# Patient Record
Sex: Female | Born: 1955 | Race: White | Hispanic: No | Smoking: Never smoker
Health system: Southern US, Community
[De-identification: ages and names within clinical notes are randomized; demographics above are authoritative.]

## PROBLEM LIST (undated history)

## (undated) HISTORY — PX: CHOLECYSTECTOMY: SHX55

---

## 2005-10-13 ENCOUNTER — Ambulatory Visit (HOSPITAL_COMMUNITY): Admission: RE | Admit: 2005-10-13 | Discharge: 2005-10-13 | Payer: Self-pay | Admitting: Surgery

## 2005-10-13 ENCOUNTER — Encounter (INDEPENDENT_AMBULATORY_CARE_PROVIDER_SITE_OTHER): Payer: Self-pay | Admitting: *Deleted

## 2006-08-08 LAB — CONVERTED CEMR LAB: Pap Smear: NORMAL

## 2006-12-22 IMAGING — RF DG CHOLANGIOGRAM OPERATIVE
1 series · 4 of 4 positions shown · non-contrast
Comparison: none

CLINICAL DATA: Gallstones.  
OPERATIVE CHOLANGIOGRAM ? 61 C-ARM GUIDED FLUOROSCOPIC IMAGES:

[Series 1: run · 4 of 61 frames shown]
[frame 10/61]
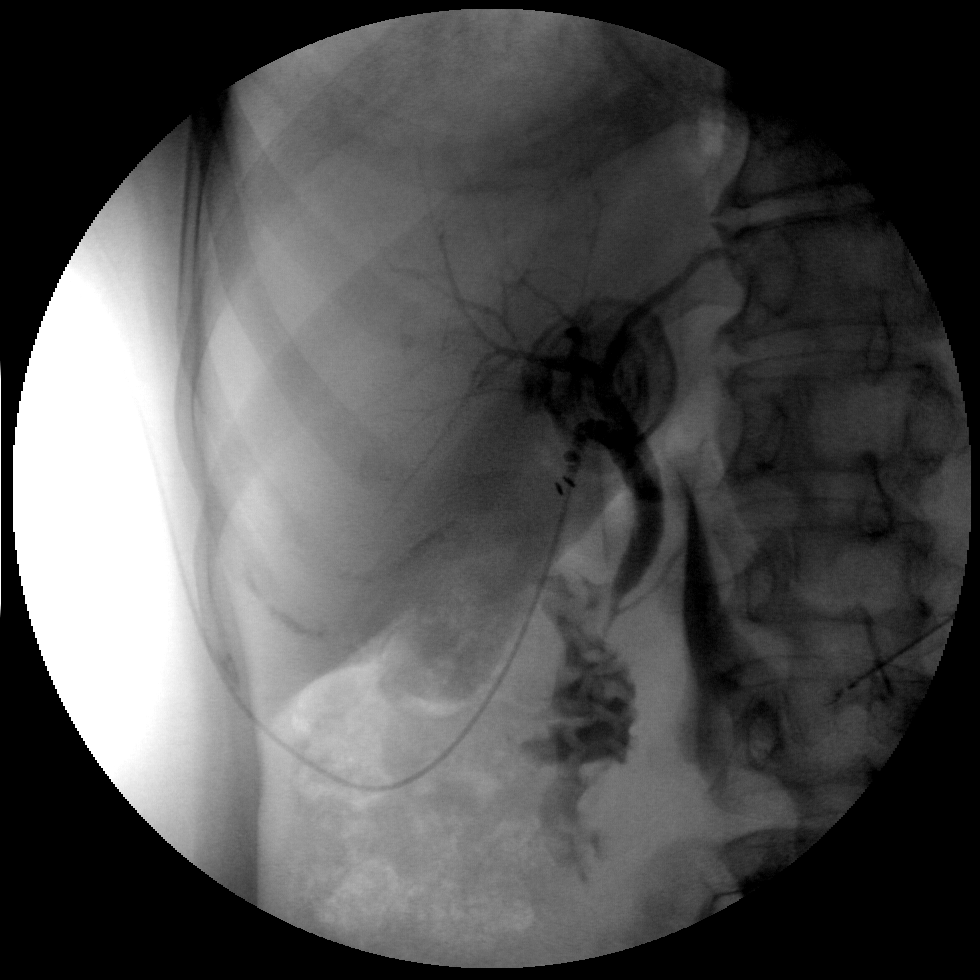
[frame 31/61]
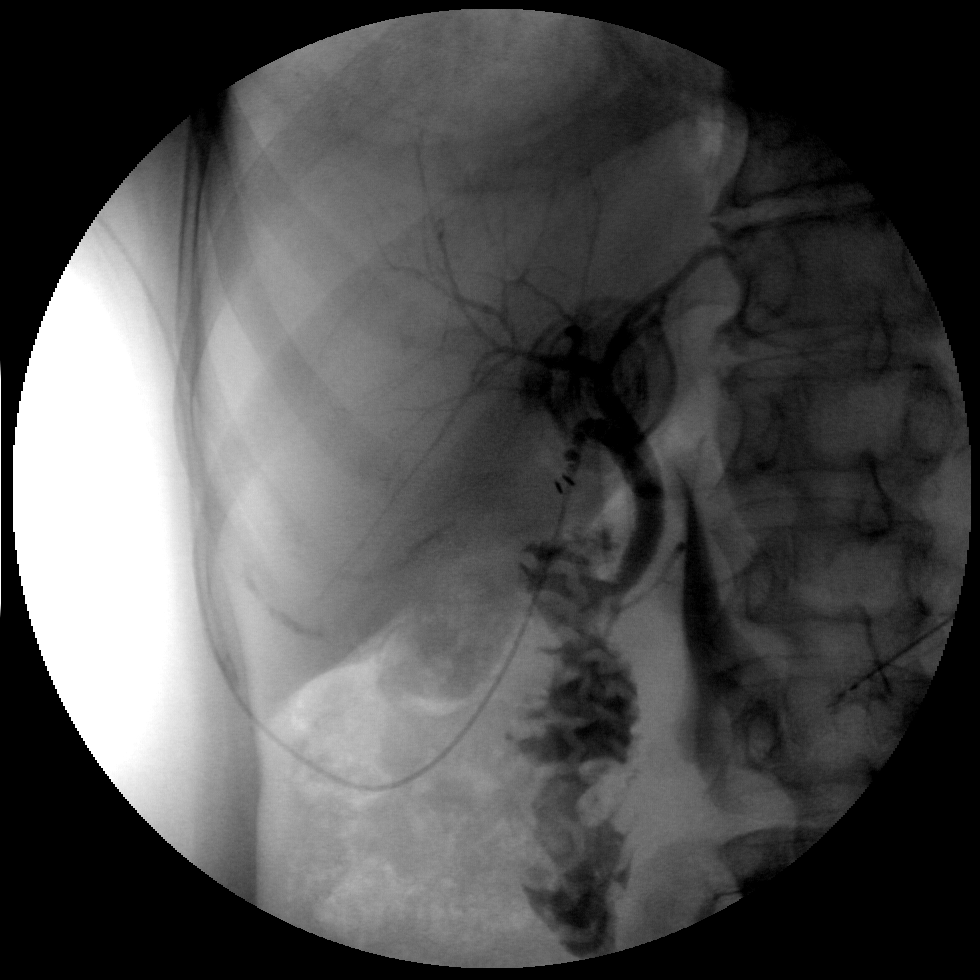
[frame 41/61]
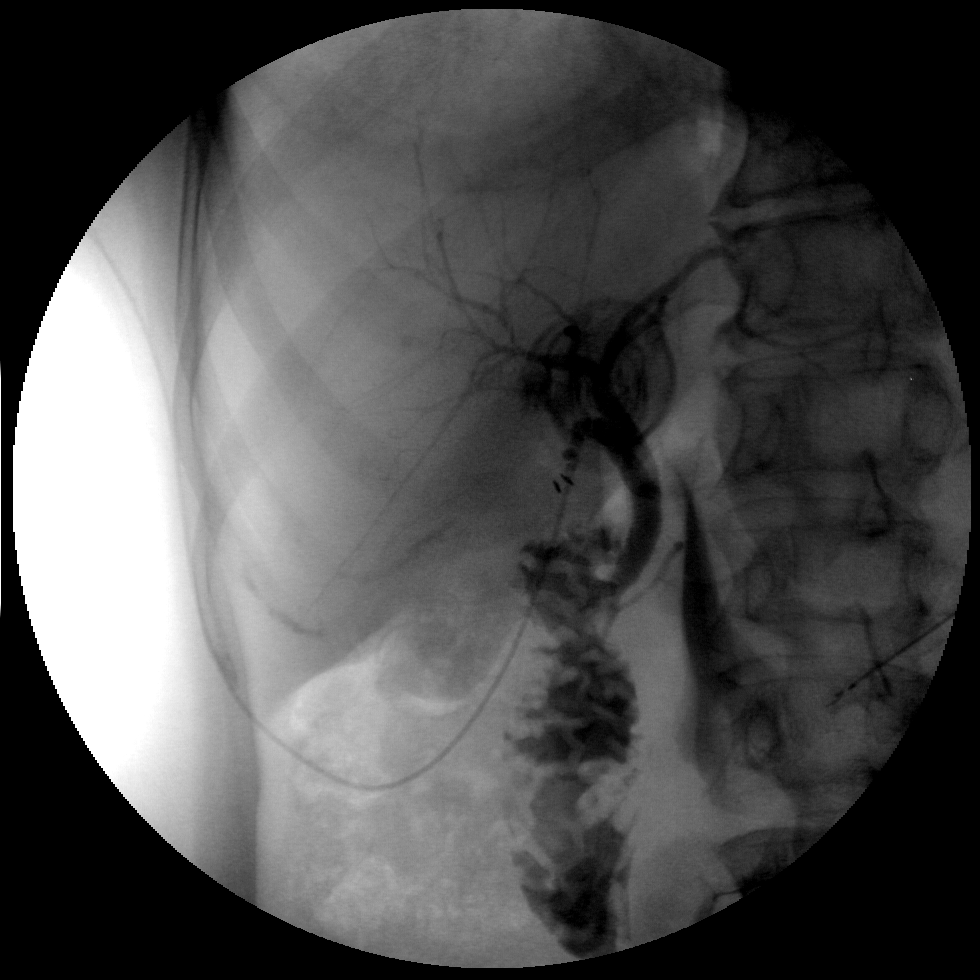
[frame 52/61]
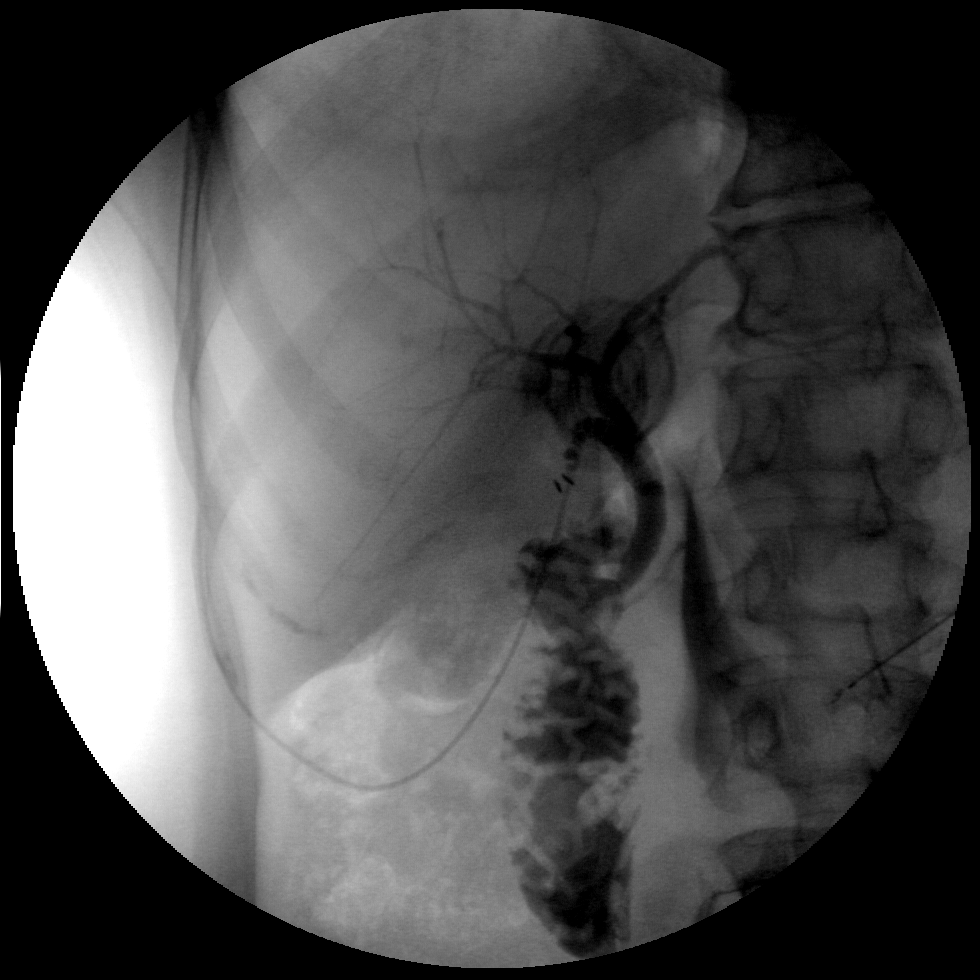

[4 of 4 positions shown; findings below may reference images not displayed]

FINDINGS: Excellent contrast opacification of the intra and extrahepatic bile ducts.  Reflux of contrast into the pancreatic duct.  No ductal stricture, dilatation, or filling defects.  Large apparently calcified laminated oval structure projecting over the biliary tree may represent a large gallstone.
IMPRESSION: Negative operative cholangiogram.

## 2008-07-21 ENCOUNTER — Encounter: Payer: Self-pay | Admitting: Family Medicine

## 2008-08-08 HISTORY — PX: COLONOSCOPY: SHX174

## 2009-03-20 ENCOUNTER — Ambulatory Visit: Payer: Self-pay | Admitting: Family Medicine

## 2009-07-14 ENCOUNTER — Ambulatory Visit: Payer: Self-pay | Admitting: Family Medicine

## 2009-07-14 DIAGNOSIS — H6500 Acute serous otitis media, unspecified ear: Secondary | ICD-10-CM | POA: Insufficient documentation

## 2010-12-24 NOTE — Op Note (Signed)
NAME:  Jaime Briggs, Jaime Briggs               ACCOUNT NO.:  [id number]   MEDICAL RECORD NO.:  [id number]          PATIENT TYPE:  AMB   LOCATION:  DAY                          FACILITY:  [facility]   PHYSICIAN:  Thomas A. Cornett, M.D.DATE OF BIRTH:  02/17/1956   DATE OF PROCEDURE:  10/13/2005  DATE OF DISCHARGE:                                 OPERATIVE REPORT   PREOP DIAGNOSIS:  Symptomatic cholelithiasis.   POSTOP DIAGNOSIS:  Symptomatic cholelithiasis.   PROCEDURE:  Laparoscopic cholecystectomy with intraoperative cholangiogram.   SURGEON:  Thomas A. Cornett, M.D.   ASSISTANT:  Sheppard Plumber. Earlene Plater, M.D.   ANESTHESIA:  General endotracheal anesthesia.   ESTIMATED BLOOD LOSS:  20 mL.   DRAINS:  None.   SPECIMEN:  Gallbladder to pathology for evaluation.   INDICATIONS FOR PROCEDURE:  The patient is a 55 year old female whose had  symptomatic cholelithiasis. She was symptomatic for abdominal pain and  gallstones; and I recommended a laparoscopic cholecystectomy for definitive  treatment of this. Risks of the procedure were discussed as well as  potential long-term outcomes and expectations. She understood and agreed to  proceed after receiving informed consent.   DESCRIPTION OF PROCEDURE:  The patient was brought to the operating room and  placed supine. After induction of general endotracheal anesthesia the  abdomen was prepped and draped in a sterile fashion. A 1-cm, supraumbilical  incision was made. Dissection was carried down to her fascia. Her fascia was  grasped with a Kocher and a small incision was made in the fascia at the  umbilicus. A second Kocher clamp was used to grab the other edge; and this  was opened with the scalpel blade more.   I placed a hemostat to open the peritoneal lining and then placed my finger  in the preperitoneal space and pushed on through. I swept around and felt no  adhesions. Pursestring suture of #0 Vicryl was placed; and a 12-mm Hassan  cannula was  placed under direct vision. Pneumoperitoneum was created to 15  mmHg with CO2 and a laparoscope was placed. The patient was placed in  reverse Trendelenburg and rolled toward her left. Laparoscopy was performed.  No evidence of solid organ or hollow organ injury with insertion of Hasson.   Next a 5-mm port was placed in the subxiphoid position. Two other 5-mm ports  were placed both in the right mid abdomen under direct vision with local  anesthesia. The gallbladder is identified and grasped by its dome. The dome  was retracted toward the patient's right shoulder. A second grasper was used  to grab the infundibulum. I used the dissector to pull away some filmy  adhesions to the transverse colon. Once the infundibulum was grasped, I was  able to cut the peritoneum at the junction of the cystic duct and  gallbladder infundibulum. I then was able to push away the loose areolar  tissue and dissect circumferentially around the cystic duct. The common duct  was well-visualized and out of the operative field. Once I was able to  dissect out the cystic duct circumferentially, I placed a clip on the  gallbladder side of this. Small incision was made in the cystic duct for  cholangiogram. Through a separate stab incision a Cook cholangiogram  catheter was introduced under direct vision. This was placed in the cystic  duct controlled with a clip.   Intraoperative cholangiogram using 1/2 strength Hypaque dye and fluoroscopy  was then performed which showed filling of the cystic duct into the common  duct with free flow of bile down the common duct into the duodenum. There  was also a free flow of contrast up the common hepatic duct to the  bifurcation of right and left hepatic ducts. No evidence of stones,  stricture, or injury were identified at this point. The catheter was then  removed and the cystic duct stump was triple clipped and divided.   I then dissected out the cystic artery which had a  branch that went up under  the body of the gallbladder and a second branch that went along the lateral  aspect of the gallbladder. I controlled each branch with clips and divided  that. Cautery was then used to dissect the gallbladder from the gallbladder  fossa. Using a 5-mm scope, I extracted the gallbladder from the abdominal  cavity using an EndoCatch bag; and this was passed off the field. I then  reinserted a 10-mm scope, inspected the gallbladder bed, and found it to be  hemostatic. The clips were around the cystic artery and branches as well as  the cystic duct. There was no obvious injury to the duodenum colon or  undersurface of the liver with examination as well. Irrigation was used and  was suctioned out until clear. Hemostasis was excellent.   At this point I saw no evidence of solid or hollow organ injury following  inspection of the abdominal cavity. I then withdrew the ports under direct  vision, withdrew the laparoscope and released the CO2. Umbilical port fascia  was closed with #0 Vicryl, 4-0 Monocryl was used to close skin incisions.  Steri-Strips and dry dressings were applied. All final counts of sponge,  needle, and instruments were found be correct at this portion of the case.  The patient was awoke, taken to recovery in satisfactory condition.      Thomas A. Cornett, M.D.  Electronically Signed     TAC/MEDQ  D:  10/13/2005  T:  10/14/2005  Job:  [id number]

## 2011-06-01 ENCOUNTER — Ambulatory Visit (INDEPENDENT_AMBULATORY_CARE_PROVIDER_SITE_OTHER): Payer: BC Managed Care – PPO

## 2011-06-01 DIAGNOSIS — Z23 Encounter for immunization: Secondary | ICD-10-CM

## 2012-01-29 ENCOUNTER — Encounter (HOSPITAL_COMMUNITY): Payer: Self-pay

## 2012-01-29 ENCOUNTER — Emergency Department (HOSPITAL_COMMUNITY)
Admission: EM | Admit: 2012-01-29 | Discharge: 2012-01-29 | Disposition: A | Discharge status: Home / Self Care | Payer: BC Managed Care – PPO | Attending: Emergency Medicine | Admitting: Emergency Medicine

## 2012-01-29 DIAGNOSIS — L509 Urticaria, unspecified: Secondary | ICD-10-CM | POA: Insufficient documentation

## 2012-01-29 DIAGNOSIS — IMO0001 Reserved for inherently not codable concepts without codable children: Secondary | ICD-10-CM | POA: Insufficient documentation

## 2012-01-29 DIAGNOSIS — W57XXXA Bitten or stung by nonvenomous insect and other nonvenomous arthropods, initial encounter: Secondary | ICD-10-CM

## 2012-01-29 MED ORDER — CEPHALEXIN 250 MG PO CAPS: 250.0000 mg | ORAL_CAPSULE | Freq: Four times a day (QID) | ORAL | Status: AC

## 2012-01-29 MED ORDER — PREDNISONE 10 MG PO TABS: 40.0000 mg | ORAL_TABLET | Freq: Every day | ORAL | Status: DC

## 2012-01-29 NOTE — ED Notes (Signed)
Discharge instructions reviewed with patient no questions at this time  

## 2012-01-29 NOTE — Discharge Instructions (Signed)
Insect Bite Mosquitoes, flies, fleas, bedbugs, and many other insects can bite. Insect bites are different from insect stings. A sting is when venom is injected into the skin. Some insect bites can transmit infectious diseases. SYMPTOMS  Insect bites usually turn red, swell, and itch for 2 to 4 days. They often go away on their own. TREATMENT  Your caregiver may prescribe antibiotic medicines if a bacterial infection develops in the bite. HOME CARE INSTRUCTIONS  Do not scratch the bite area.   Keep the bite area clean and dry. Wash the bite area thoroughly with soap and water.   Put ice or cool compresses on the bite area.   Put ice in a plastic bag.   Place a towel between your skin and the bag.   Leave the ice on for 20 minutes, 4 times a day for the first 2 to 3 days, or as directed.   You may apply a baking soda paste, cortisone cream, or calamine lotion to the bite area as directed by your caregiver. This can help reduce itching and swelling.   Only take over-the-counter or prescription medicines as directed by your caregiver.   If you are given antibiotics, take them as directed. Finish them even if you start to feel better.  You may need a tetanus shot if:  You cannot remember when you had your last tetanus shot.   You have never had a tetanus shot.   The injury broke your skin.  If you get a tetanus shot, your arm may swell, get red, and feel warm to the touch. This is common and not a problem. If you need a tetanus shot and you choose not to have one, there is a rare chance of getting tetanus. Sickness from tetanus can be serious. SEEK IMMEDIATE MEDICAL CARE IF:   You have increased pain, redness, or swelling in the bite area.   You see a red line on the skin coming from the bite.   You have a fever.   You have joint pain.   You have a headache or neck pain.   You have unusual weakness.   You have a rash.   You have chest pain or shortness of breath.   You  have abdominal pain, nausea, or vomiting.   You feel unusually tired or sleepy.  MAKE SURE YOU:   Understand these instructions.   Will watch your condition.   Will get help right away if you are not doing well or get worse.  Document Released: 09/01/2004 Document Revised: 07/14/2011 Document Reviewed: 02/23/2011 [employer] Patient Information 2012 [employer]. 

## 2012-01-29 NOTE — ED Provider Notes (Signed)
Medical screening examination/treatment/procedure(s) were performed by non-physician practitioner and as supervising physician I was immediately available for consultation/collaboration.  Lily Kernen R. Nahia Nissan, MD 01/29/12 1414 

## 2012-01-29 NOTE — ED Notes (Signed)
Pt c/o bug bite on left forearm, and itchy rash on inner thighs both onset Friday.

## 2012-01-29 NOTE — ED Provider Notes (Signed)
History     CSN: [id number]  Arrival date & time 01/29/12  5621   First MD Initiated Contact with Patient 01/29/12 [phone]      Chief Complaint  Patient presents with  . Rash    (Consider location/radiation/quality/duration/timing/severity/associated sxs/prior treatment) HPI  Patient to the ED with complaints of rash that itches on inner thighs and bug bite to left forearm. She says the symptoms have been their since Friday and she does not believe to be related. The patient did not see an insect bite her and the rash to the forearm does not itch or hurt. It does not feel warm. She denies knowing what she may have come into contact with. She denies N/V/D/F/ chills or generalized weakness and myalgias. VSS and pt in NAD  No past medical history on file.  Past Surgical History  Procedure Date  . Cholecystectomy     No family history on file.  History  Substance Use Topics  . Smoking status: Never Smoker   . Smokeless tobacco: Not on file  . Alcohol Use: Yes     occasionally    OB History    Grav Para Term Preterm Abortions TAB SAB Ect Mult Living                  Review of Systems   HEENT: denies blurry vision or change in hearing PULMONARY: Denies difficulty breathing and SOB CARDIAC: denies chest pain or heart palpitations MUSCULOSKELETAL:  denies being unable to ambulate ABDOMEN AL: denies abdominal pain GU: denies loss of bowel or urinary control NEURO: denies numbness and tingling in extremities SKIN: pt has two new rashes PSYCH: patient behavior is normal NECK: Not complaining of neck pain     Allergies  Review of patient's allergies indicates no known allergies.  Home Medications   Current Outpatient Rx  Name Route Sig Dispense Refill  . CALCIUM + D PO Oral Take 2 tablets by mouth daily.    . OMEGA-3 FATTY ACIDS 1000 MG PO CAPS Oral Take 1 g by mouth daily.    . ADULT MULTIVITAMIN W/MINERALS CH Oral Take 1 tablet by mouth daily.      BP 122/92   Pulse 66  Temp 98 F (36.7 C) (Oral)  Resp 18  SpO2 100%  Physical Exam  Nursing note and vitals reviewed. Constitutional: She appears well-developed and well-nourished. No distress.  HENT:  Head: Normocephalic and atraumatic.  Eyes: Pupils are equal, round, and reactive to light.  Neck: Normal range of motion. Neck supple.  Cardiovascular: Normal rate and regular rhythm.   Pulmonary/Chest: Effort normal.  Abdominal: Soft.  Neurological: She is alert.  Skin: Skin is warm and dry.       ED Course  Procedures (including critical care time)  Labs Reviewed - No data to display No results found.   1. Insect bite       MDM  I have prescribed Keflex and prednisone to patient. As well as discussed the importance of scheduling an appointment with the Dermatologist for follow-up. I see no red flag signs at this time but have discussed symptoms that warrant her prompt return to the ED.  Pt has been advised of the symptoms that warrant their return to the ED. Patient has voiced understanding and has agreed to follow-up with the PCP or specialist.         Dorthula Matas, PA 01/29/12 [phone]

## 2012-02-01 ENCOUNTER — Ambulatory Visit (INDEPENDENT_AMBULATORY_CARE_PROVIDER_SITE_OTHER): Payer: BC Managed Care – PPO | Admitting: Family Medicine

## 2012-02-01 VITALS — BP 140/80 | Temp 98.7°F | Wt 119.0 lb

## 2012-02-01 DIAGNOSIS — R21 Rash and other nonspecific skin eruption: Secondary | ICD-10-CM

## 2012-02-01 MED ORDER — METHYLPREDNISOLONE ACETATE 80 MG/ML IJ SUSP: 80.0000 mg | Freq: Once | INTRAMUSCULAR | Status: DC

## 2012-02-01 NOTE — Progress Notes (Signed)
  Subjective:    Patient ID: Jaime Briggs, female    DOB: 07/29/1956, 56 y.o.   MRN: [id number]  HPI  Acute visit. Pruritic erythematous macular rash noted less than one week ago.  Rash is nonscaly and nonpustular. Initially noted involvement right thigh and subsequently left inner thigh. Also involvement posterior neck and forehead region.  Patient went to the emergency department and was prescribed oral prednisone 40 mg daily for 4 days. Thus far has not seen much improvement. Possibly some mild relief of itching. No fever. No tenderness. No history of eczema. She's also tried topical cortisone cream without much improvement. No exacerbating factors.   Review of Systems  Constitutional: Negative for fever and chills.  Hematological: Negative for adenopathy.       Objective:   Physical Exam  Constitutional: She appears well-developed and well-nourished. No distress.  Cardiovascular: Normal rate and regular rhythm.   Skin:       Patient has macular erythematous rash with scattered patches for head, posterior neck, right and left inner thighs. No pustules. Nonscaly. Nontender. Not warm to touch. Blanches with pressure. Minimally raised. No vesicles.          Assessment & Plan:  Skin rash widely distributed as above. Appears more compatible with contact dermatitis. No evidence for eczema. Depo-Medrol 80 mg IM given. Touch base by next week if not seeing improvements.

## 2012-02-01 NOTE — Patient Instructions (Addendum)
Touch base by early next week if rash not resolving.

## 2012-02-03 ENCOUNTER — Emergency Department (HOSPITAL_COMMUNITY)
Admission: EM | Admit: 2012-02-03 | Discharge: 2012-02-03 | Disposition: A | Discharge status: Home / Self Care | Payer: BC Managed Care – PPO | Attending: Emergency Medicine | Admitting: Emergency Medicine

## 2012-02-03 ENCOUNTER — Encounter (HOSPITAL_COMMUNITY): Payer: Self-pay | Admitting: *Deleted

## 2012-02-03 DIAGNOSIS — T7840XA Allergy, unspecified, initial encounter: Secondary | ICD-10-CM | POA: Insufficient documentation

## 2012-02-03 MED ORDER — FAMOTIDINE 20 MG PO TABS: 20.0000 mg | ORAL_TABLET | Freq: Two times a day (BID) | ORAL | Status: DC

## 2012-02-03 MED ORDER — FAMOTIDINE 20 MG PO TABS
20.0000 mg | ORAL_TABLET | Freq: Once | ORAL | Status: AC
  Administered 2012-02-03: 20 mg via ORAL
  Filled 2012-02-03: qty 1

## 2012-02-03 MED ORDER — PREDNISONE 20 MG PO TABS: 60.0000 mg | ORAL_TABLET | Freq: Every day | ORAL | Status: AC

## 2012-02-03 MED ORDER — PREDNISONE 20 MG PO TABS
60.0000 mg | ORAL_TABLET | Freq: Once | ORAL | Status: AC
  Administered 2012-02-03: 60 mg via ORAL
  Filled 2012-02-03: qty 3

## 2012-02-03 MED ORDER — DIPHENHYDRAMINE HCL 50 MG/ML IJ SOLN
25.0000 mg | Freq: Once | INTRAMUSCULAR | Status: AC
  Administered 2012-02-03: 25 mg via INTRAMUSCULAR
  Filled 2012-02-03: qty 1

## 2012-02-03 NOTE — ED Notes (Signed)
Pt verbalizes understanding of d/c instructions. Alert & oriented x 4. Pt aware of md f/u.  Out of dept with spouse. dph

## 2012-02-03 NOTE — Discharge Instructions (Signed)
Read instructions below to learn more about your diagnosis and for reasons to return to the ED. Followup with your doctor if symptoms are not improving after 3-4 days.   You may return to the emergency department if symptoms worsen, become progressive, or become more concerning.  Take Benadryl and Pepcid daily.  Take Prednisone as prescribed.    Allergic Reaction  There are many allergens around [place]. It may be difficult to know what caused your reaction. You may follow up with an allergy specialist for further testing to learn more about your specific allergies.   TREATMENT AND HOME CARE INSTRUCTIONS  If hives or rash are present:  Use an over-the-counter antihistamine (Benadryl or Zyrtec) for hives and itching as needed. Do not drive or drink alcohol until medications used to treat the reaction have worn off. Antihistamines tend to make people sleepy.  Apply cold cloths (compresses) to the skin or take baths in cool water. This will help itching. Avoid hot baths or showers. Heat will make a rash and itching worse.  See Your Primary Care Doctor if:  Your allergies are becoming progressively more troublesome.  You suspect a food allergy. Symptoms generally happen within 30 minutes of eating a food.  Your symptoms have not gone away within 2 days.  SEEK IMMEDIATE MEDICAL CARE IF:  You develop difficulty breathing or wheezing, or have a tight feeling in your chest or throat (feeling like your throat is closing) You develop swollen lips or tongue You develop hives on your chest, neck or face. You are unable to swallow fluids or salvia secretions.   A severe reaction with any of the above problems should be considered life-threatening. If you suddenly develop difficulty breathing call for local emergency medical help. THIS IS AN EMERGENCY.

## 2012-02-03 NOTE — ED Notes (Signed)
Pt states she noticed a "bug bite" to left forearm on Friday and developed a rash scattered on her body. Pt seen in this ED on Sunday; pt states rash has now worsened and is burning at times. Pt with no noted respiratory distress. Husband at bedside.

## 2012-02-03 NOTE — ED Provider Notes (Signed)
History     CSN: [id number]  Arrival date & time 02/03/12  2130   First MD Initiated Contact with Patient 02/03/12 [phone]      Chief Complaint  Patient presents with  . Allergic Reaction    insect bite    (Consider location/radiation/quality/duration/timing/severity/associated sxs/prior treatment) HPI Comments: Patient reports that she has had an erythematous pruitic rash on her forehead, posterior neck, left arm, and upper thighs bilaterally for the past 5 days.   She was seen in the ED for the same thing 5 days ago.  At that time, she was discharged with Keflex to treat a possible insect bite on the right arm.  She reports that the area of the insect bite has not changed in appearance.  She denies any new lotions, shampoos, soaps, or detergents.  She denies any shortness of breath, swelling of her lips, tongue or throat.  Rash does itch, but is not painful.    Patient is a 56 y.o. female presenting with allergic reaction. The history is provided by the patient.  Allergic Reaction The primary symptoms are  rash and urticaria. The primary symptoms do not include wheezing, shortness of breath, abdominal pain, nausea, vomiting, diarrhea, dizziness or angioedema.  The rash is associated with itching.  Significant symptoms also include itching. Significant symptoms that are not present include rhinorrhea.    History reviewed. No pertinent past medical history.  Past Surgical History  Procedure Date  . Cholecystectomy     History reviewed. No pertinent family history.  History  Substance Use Topics  . Smoking status: Never Smoker   . Smokeless tobacco: Not on file  . Alcohol Use: 0.6 oz/week    1 Glasses of wine per week     occasionally    OB History    Grav Para Term Preterm Abortions TAB SAB Ect Mult Living                  Review of Systems  Constitutional: Negative for fever and chills.  HENT: Negative for facial swelling, rhinorrhea, trouble swallowing and neck pain.    Respiratory: Negative for chest tightness, shortness of breath and wheezing.   Cardiovascular: Negative for chest pain.  Gastrointestinal: Negative for nausea, vomiting, abdominal pain and diarrhea.  Musculoskeletal: Negative for myalgias and joint swelling.  Skin: Positive for itching and rash.  Neurological: Negative for dizziness, syncope and light-headedness.    Allergies  Review of patient's allergies indicates no known allergies.  Home Medications   Current Outpatient Rx  Name Route Sig Dispense Refill  . CALCIUM + D PO Oral Take 2 tablets by mouth daily.    . CEPHALEXIN 250 MG PO CAPS Oral Take 1 capsule (250 mg total) by mouth 4 (four) times daily. 28 capsule 0  . OMEGA-3 FATTY ACIDS 1000 MG PO CAPS Oral Take 1 g by mouth daily.    . ADULT MULTIVITAMIN W/MINERALS CH Oral Take 1 tablet by mouth daily.    Marland Kitchen PREDNISONE 10 MG PO TABS Oral Take 4 tablets (40 mg total) by mouth daily. 14 tablet 0    BP 144/79  Pulse 67  Temp 97.5 F (36.4 C) (Oral)  Resp 18  SpO2 100%  Physical Exam  Nursing note and vitals reviewed. Constitutional: She is oriented to person, place, and time. She appears well-developed and well-nourished. No distress.  HENT:  Head: Normocephalic and atraumatic.  Mouth/Throat: Oropharynx is clear and moist and mucous membranes are normal.  No sign of airway obstruction. No edema of face, eyelids, lips, tongue, uvula.Marland Kitchen Uvula midline, no nasal congestion or drooling.  Tongue not elevated. No trismus.  Neck: Trachea normal, normal range of motion and full passive range of motion without pain. Neck supple. Carotid bruit is not present. No tracheal deviation present.       No stridor  Cardiovascular: Normal rate, regular rhythm, intact distal pulses and normal pulses.        Not tachycardic  Pulmonary/Chest: Effort normal. No stridor.  Musculoskeletal: Normal range of motion.  Neurological: She is alert and oriented to person, place, and time.  Skin:  Skin is warm and intact. Rash noted. Rash is urticarial. She is not diaphoretic.       Not diaphoretic. Raised erythematous welts, pruritic in nature, located on the inner thighs bilaterally, posterior neck, left arm, and forehead. Blanchable urticaria, no petechiae or purpura.   Psychiatric: She has a normal mood and affect. Her behavior is normal.    ED Course  Procedures (including critical care time)  Labs Reviewed - No data to display No results found.   1. Allergic reaction       MDM  Patient re-evaluated prior to dc, is hemodynamically stable, in no respiratory distress, and denies the feeling of throat closing. Pt has been advised to take OTC benadryl, Pepcid, and given a Rx for Prednisone.  Return to the ED if they have a mod-severe allergic rxn (s/s including throat closing, difficulty breathing, swelling of lips face or tongue). Pt is to follow up with their PCP. Pt is agreeable with plan & verbalizes understanding.        Pascal Lux [place], PA-C 02/03/12 1720  Pascal Lux [place], PA-C 02/03/12 1721

## 2012-02-03 NOTE — ED Notes (Signed)
Patient is alert and orietned x3.  She was seen in the [facility] on Sunday for an insect bite. During the week she states that she started developing hives along the forehead, neck, left arm And bilaterally on the inner thigh area with redness swelling and itching.

## 2012-02-04 NOTE — ED Provider Notes (Signed)
Medical screening examination/treatment/procedure(s) were performed by non-physician practitioner and as supervising physician I was immediately available for consultation/collaboration.  Doug Sou, MD 02/04/12 1191

## 2012-02-06 ENCOUNTER — Ambulatory Visit (INDEPENDENT_AMBULATORY_CARE_PROVIDER_SITE_OTHER): Payer: BC Managed Care – PPO | Admitting: Family Medicine

## 2012-02-06 ENCOUNTER — Encounter: Payer: Self-pay | Admitting: Family Medicine

## 2012-02-06 VITALS — BP 110/70 | Temp 98.5°F | Wt 118.0 lb

## 2012-02-06 DIAGNOSIS — R21 Rash and other nonspecific skin eruption: Secondary | ICD-10-CM

## 2012-02-06 NOTE — Progress Notes (Signed)
  Subjective:    Patient ID: Jaime Briggs, female    DOB: [birth year], 56 y.o.   MRN: [id number]  HPI  Patient seen with one week history of pruritic rash which initially started left forearm is now bilateral arms, upper to mid thigh, and posterior neck as well as forehead.  She initially went to urgent care was given oral prednisone and subsequently seen here and received intramuscular Depo-Medrol. She has taken Allegra and Benadryl. She subsequently went to emergency room this past Sunday yesterday (after no improvement) and was given another prescription of oral prednisone along with Pepcid 20 mg twice a day. She's taking both Allegra and Benadryl. She is very anxious about the rash. This is pruritic and nonpainful. No fever or chills. No change in soaps or detergents. No food allergy. No new medications. Still has significant itching. No appetite or weight changes. No regular chronic medications. Denies headache, fever, arthralgia, abdominal pain.   Review of Systems  Constitutional: Negative for fever, chills and unexpected weight change.  HENT: Negative for sore throat.   Gastrointestinal: Negative for abdominal pain.  Hematological: Negative for adenopathy.  Psychiatric/Behavioral: The patient is nervous/anxious.        Objective:   Physical Exam  Constitutional: She appears well-developed and well-nourished.  Cardiovascular: Normal rate and regular rhythm.   Pulmonary/Chest: Effort normal and breath sounds normal. No respiratory distress. She has no wheezes. She has no rales.  Skin:       Patient has slightly raised erythematous somewhat urticarial type rash posterior neck upper extremities and lower extremities. Involvement of forehead is starting to fade          Assessment & Plan:  Skin rash. Generalized involvement. Initially thought this looked more like contact dermatitis now suspect more likely urticarial. She's been on multiple medications including Benadryl, Pepcid,  Allegra, and prednisone with slow if any improvement. Recommend dermatology referral. Consider other options such as doxepin. We discussed possible skin biopsy but we'll try to proceed with dermatology referral instead if she can get in over the next couple days

## 2012-02-06 NOTE — Patient Instructions (Addendum)
Hives Hives (urticaria) are itchy, red, swollen patches on the skin. They may change size, shape, and location quickly and repeatedly. Hives that occur deeper in the skin can cause swelling of the hands, feet, and face. Hives may be an allergic reaction to something you or your child ate, touched, or put on the skin. Hives can also be a reaction to cold, heat, viral infections, medication, insect bites, or emotional stress. Often the cause is hard to find. Hives can come and go for several days to several weeks. Hives are not contagious. HOME CARE INSTRUCTIONS   If the cause of the hives is known, avoid exposure to that source.   To relieve itching and rash:   Apply cold compresses to the skin or take cool water baths. Do not take or give your child hot baths or showers because the warmth will make the itching worse.   The best medicine for hives is an antihistamine. An antihistamine will not cure hives, but it will reduce their severity. You can use an antihistamine available over the counter. This medicine may make your child sleepy. Teenagers should not drive while using this medicine.   Take or give an antihistamine every 6 hours until the hives are completely gone for 24 hours or as directed.   Your child may have other medications prescribed for itching. Give these as directed by your child's caregiver.   You or your child should wear loose fitting clothing, including undergarments. Skin irritations may make hives worse.   Follow-up as directed by your caregiver.  SEEK MEDICAL CARE IF:   You or your child still have considerable itching after taking the medication (prescribed or purchased over the counter).   Joint swelling or pain occurs.  SEEK IMMEDIATE MEDICAL CARE IF:   You have a fever.   Swollen lips or tongue are noticed.   There is difficulty with breathing, swallowing, or tightness in the throat or chest.   Abdominal pain develops.   Your child starts acting very  sick.  These may be the first signs of a life-threatening allergic reaction. THIS IS AN EMERGENCY. Call 911 for medical help. MAKE SURE YOU:   Understand these instructions.   Will watch your condition.   Will get help right away if you are not doing well or get worse.  Document Released: 07/25/2005 Document Revised: 07/14/2011 Document Reviewed: 03/14/2008 ExitCare Patient Information 2012 [employer]. 

## 2012-02-08 ENCOUNTER — Other Ambulatory Visit: Payer: Self-pay | Admitting: Dermatology

## 2012-06-05 ENCOUNTER — Ambulatory Visit: Payer: BC Managed Care – PPO

## 2012-06-08 ENCOUNTER — Ambulatory Visit (INDEPENDENT_AMBULATORY_CARE_PROVIDER_SITE_OTHER): Payer: BC Managed Care – PPO | Admitting: Family Medicine

## 2012-06-08 DIAGNOSIS — Z23 Encounter for immunization: Secondary | ICD-10-CM

## 2013-06-18 ENCOUNTER — Ambulatory Visit (INDEPENDENT_AMBULATORY_CARE_PROVIDER_SITE_OTHER): Payer: BC Managed Care – PPO

## 2013-06-18 DIAGNOSIS — Z23 Encounter for immunization: Secondary | ICD-10-CM

## 2013-11-26 ENCOUNTER — Encounter: Payer: Self-pay | Admitting: Family Medicine

## 2013-11-26 ENCOUNTER — Ambulatory Visit (INDEPENDENT_AMBULATORY_CARE_PROVIDER_SITE_OTHER): Payer: BC Managed Care – PPO | Admitting: Family Medicine

## 2013-11-26 VITALS — BP 116/66 | HR 78 | Temp 97.6°F | Wt 111.0 lb

## 2013-11-26 DIAGNOSIS — M2550 Pain in unspecified joint: Secondary | ICD-10-CM

## 2013-11-26 DIAGNOSIS — G44209 Tension-type headache, unspecified, not intractable: Secondary | ICD-10-CM

## 2013-11-26 LAB — CBC WITH DIFFERENTIAL/PLATELET
Basophils Absolute: 0 10*3/uL (ref 0.0–0.1)
Basophils Relative: 0.2 % (ref 0.0–3.0)
EOS PCT: 8.3 % — AB (ref 0.0–5.0)
Eosinophils Absolute: 0.5 10*3/uL (ref 0.0–0.7)
HCT: 39.5 % (ref 36.0–46.0)
HEMOGLOBIN: 13.1 g/dL (ref 12.0–15.0)
LYMPHS ABS: 1.1 10*3/uL (ref 0.7–4.0)
LYMPHS PCT: 16.7 % (ref 12.0–46.0)
MCHC: 33.2 g/dL (ref 30.0–36.0)
MCV: 94.9 fl (ref 78.0–100.0)
MONOS PCT: 11.5 % (ref 3.0–12.0)
Monocytes Absolute: 0.7 10*3/uL (ref 0.1–1.0)
NEUTROS ABS: 4 10*3/uL (ref 1.4–7.7)
NEUTROS PCT: 63.3 % (ref 43.0–77.0)
PLATELETS: 262 10*3/uL (ref 150.0–400.0)
RBC: 4.16 Mil/uL (ref 3.87–5.11)
RDW: 13.6 % (ref 11.5–14.6)
WBC: 6.3 10*3/uL (ref 4.5–10.5)

## 2013-11-26 LAB — SEDIMENTATION RATE: Sed Rate: 22 mm/hr (ref 0–22)

## 2013-11-26 MED ORDER — CYCLOBENZAPRINE HCL 5 MG PO TABS: 5.0000 mg | ORAL_TABLET | Freq: Every day | ORAL | Status: DC

## 2013-11-26 NOTE — Patient Instructions (Addendum)
Tension Headache A tension headache is a feeling of pain, pressure, or aching often felt over the front and sides of the head. The pain can be dull or can feel tight (constricting). It is the most common type of headache. Tension headaches are not normally associated with nausea or vomiting and do not get worse with physical activity. Tension headaches can last 30 minutes to several days.  CAUSES  The exact cause is not known, but it may be caused by chemicals and hormones in the brain that lead to pain. Tension headaches often begin after stress, anxiety, or depression. Other triggers may include:  Alcohol.  Caffeine (too much or withdrawal).  Respiratory infections (colds, flu, sinus infections).  Dental problems or teeth clenching.  Fatigue.  Holding your head and neck in one position too long while using a computer. SYMPTOMS   Pressure around the head.   Dull, aching head pain.   Pain felt over the front and sides of the head.   Tenderness in the muscles of the head, neck, and shoulders. DIAGNOSIS  A tension headache is often diagnosed based on:   Symptoms.   Physical examination.   A CT scan or MRI of your head. These tests may be ordered if symptoms are severe or unusual. TREATMENT  Medicines may be given to help relieve symptoms.  HOME CARE INSTRUCTIONS   Only take over-the-counter or prescription medicines for pain or discomfort as directed by your caregiver.   Lie down in a dark, quiet room when you have a headache.   Keep a journal to find out what may be triggering your headaches. For example, write down:  What you eat and drink.  How much sleep you get.  Any change to your diet or medicines.  Try massage or other relaxation techniques.   Ice packs or heat applied to the head and neck can be used. Use these 3 to 4 times per day for 15 to 20 minutes each time, or as needed.   Limit stress.   Sit up straight, and do not tense your muscles.    Quit smoking if you smoke.  Limit alcohol use.  Decrease the amount of caffeine you drink, or stop drinking caffeine.  Eat and exercise regularly.  Get 7 to 9 hours of sleep, or as recommended by your caregiver.  Avoid excessive use of pain medicine as recurrent headaches can occur.  SEEK MEDICAL CARE IF:   You have problems with the medicines you were prescribed.  Your medicines do not work.  You have a change from the usual headache.  You have nausea or vomiting. SEEK IMMEDIATE MEDICAL CARE IF:   Your headache becomes severe.  You have a fever.  You have a stiff neck.  You have loss of vision.  You have muscular weakness or loss of muscle control.  You lose your balance or have trouble walking.  You feel faint or pass out.  You have severe symptoms that are different from your first symptoms. MAKE SURE YOU:   Understand these instructions.  Will watch your condition.  Will get help right away if you are not doing well or get worse. Document Released: 07/25/2005 Document Revised: 10/17/2011 Document Reviewed: 07/15/2011   Headaches, Analgesic Rebound Analgesic agents are prescription or over-the-counter medications used to control pain, including headaches. However, overuse or misuse of theses medications can lead to rebound headaches. Rebound headaches are headaches that recur after the analgesic medication wears off. Eventually, the rebound headaches can become  longlasting (chronic). If this happens, you must completely stop using analgesic medications. If not, the chronic headache is likely to continue despite the use of any other treatment. Usually when you stop taking analgesic medications, the headache may initally get worse for several days. Along with this you may experience sickness in your stomach (nausea), and you may throw up (vomit). After a period of 3 to 5 days, these symptoms begin to improve. Sometimes improvement may take longer. Eventually,  the headaches will slowly improve with treatment with the right medications. Most people are able to stop using analgesic medications at home with a caregiver's supervision. But some find it difficult and may require hospitalization. Document Released: 10/15/2003 Document Revised: 10/17/2011 Document Reviewed: 02/07/2013 [facility] Patient Information 2014 [place], [place].    ExitCare Patient Information 2014 [place].

## 2013-11-26 NOTE — Progress Notes (Signed)
Pre visit review using our clinic review tool, if applicable. No additional management support is needed unless otherwise documented below in the visit note. 

## 2013-11-26 NOTE — Progress Notes (Signed)
Subjective:    Patient ID: Jaime Briggs, female    DOB: [birth year], 58 y.o.   MRN: [id number]  HPI Comments: Patient is a 58 year-old female presenting with complaints of headache, neck pain and arthralgias x 1 week.   Headaches began last week, occur once daily, described as a bilateral dull achy pain with some sinus involvement, relieved by advil. Not exacerbated by head movements. No photophobia or phonophobia.The headaches don't come at any specific time, unpredictable, no aura, no history of migraines but history of headaches while going through menopause but never treated. No dizziness, no history of head trauma. 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 Has not been sick recently, and patient denies runny nose, cough, visiual changes or double vision, change in smell, sore throat, cough, shortness of breath or chest pain. Patient endorses tinnitus but was seen at [facility] by ENT and evaluated by CT with no abnormalities found.   Joint aches began at the same time as the headaches. Joint aches are worse in morning, severity subsides throughout the day, and begins to increase in the evening. She has been awakened from sleep from the pain. Pain currently managed by advil. Patient's daily activities are not limited and she is continuing to exercise by walking. Elbows, wrists, hands and knees involved. Never had surgery on any of those joints before. No joint swelling, but feels fingers are bigger than they used to be. No history of tick bites.  No sick contacts, got the flu shot this year, last colonoscopy was 5 years ago with normal results, and last mammogram was last spring; she is planning to schedule an appointment this spring.      Headache  Associated symptoms include neck pain, sinus pressure and tinnitus. Pertinent negatives include no back pain, dizziness, fever, nausea, rhinorrhea, sore throat, vomiting or weakness.       Review of Systems  Constitutional: Negative for fever, activity change, appetite change and unexpected weight change.  HENT: Positive for postnasal drip, sinus pressure and tinnitus. Negative for rhinorrhea and sore throat.   Respiratory: Negative for shortness of breath.   Cardiovascular: Negative for chest pain.  Gastrointestinal: Negative for nausea, vomiting, diarrhea and constipation.  Musculoskeletal: Positive for arthralgias and neck pain. Negative for back pain, gait problem, joint swelling and myalgias.  Neurological: Positive for headaches. Negative for dizziness and weakness.       Objective:   Physical Exam  Vitals reviewed. Constitutional: She appears well-developed and well-nourished.  HENT:  Head: Normocephalic.  Right Ear: Tympanic membrane normal.  Left Ear: Tympanic membrane normal.  Nose: No rhinorrhea or sinus tenderness. Right sinus exhibits no maxillary sinus tenderness and no frontal sinus tenderness. Left sinus exhibits no maxillary sinus tenderness and no frontal sinus tenderness.  Mouth/Throat: Uvula is midline, oropharynx is clear and moist and mucous membranes are normal.  Eyes: Conjunctivae and EOM are normal. Pupils are equal, round, and reactive to light.  Neck: Normal range of motion. No thyromegaly present.  Cardiovascular: Normal rate.   Pulmonary/Chest: Effort normal and breath sounds normal.  Abdominal: Soft. There is no tenderness.  Musculoskeletal: Normal range of motion. She exhibits no tenderness.  No signs of redness or swelling in elbows, wrists, fingers, or knees.   Neurological: She is alert. She has normal strength. No cranial nerve deficit or sensory deficit. Coordination and gait normal.  Reflex Scores:      Bicep reflexes are  2+ on the right side.      Patellar reflexes are 2+ on the right side and 2+ on the left side. Skin: Skin is warm.          Assessment & Plan:  1. Tension Headache Recommend Flonase or Nasocort.  Will try low dose flexeril to help with neck tension and get rest at night. Stay hydrated, keep keyboard low. Advise against constant daily use of analgesics because of rebound headache. Educational handout given on headaches. 2. Polyarthralgia ANA, CBC, sed rate, CCP antibody ordered to evaluate joint aches.  3. Health Maintenance Will schedule complete physical to check labs.   [place], PA-S  As above.  Suspect tension type headache.  She does not have any objective inflammatory changes.  No recent tick bites.  Carolann Littler, MD

## 2013-11-27 LAB — ANTI-NUCLEAR AB-TITER (ANA TITER): ANA TITER 1: NEGATIVE (ref <1:40)

## 2013-11-27 LAB — CYCLIC CITRUL PEPTIDE ANTIBODY, IGG: Cyclic Citrullin Peptide Ab: <2 U/mL (ref 0.0–5.0)

## 2013-11-27 LAB — ANA: Anti Nuclear Antibody(ANA): POSITIVE — AB

## 2013-11-28 ENCOUNTER — Telehealth: Payer: Self-pay | Admitting: Family Medicine

## 2013-11-28 NOTE — Telephone Encounter (Signed)
Please call pt with lab results from 4/21 at your earliest convenience Thanks.

## 2013-11-28 NOTE — Telephone Encounter (Signed)
Pt informed

## 2013-12-26 ENCOUNTER — Other Ambulatory Visit: Payer: BC Managed Care – PPO

## 2014-01-02 ENCOUNTER — Encounter: Payer: BC Managed Care – PPO | Admitting: Family Medicine

## 2014-01-14 LAB — HM MAMMOGRAPHY

## 2014-02-19 ENCOUNTER — Other Ambulatory Visit: Payer: Self-pay | Admitting: Family Medicine

## 2014-04-22 ENCOUNTER — Other Ambulatory Visit: Payer: BC Managed Care – PPO

## 2014-04-29 ENCOUNTER — Encounter: Payer: BC Managed Care – PPO | Admitting: Family Medicine

## 2014-05-12 ENCOUNTER — Other Ambulatory Visit (INDEPENDENT_AMBULATORY_CARE_PROVIDER_SITE_OTHER): Payer: BC Managed Care – PPO

## 2014-05-12 DIAGNOSIS — Z0000 Encounter for general adult medical examination without abnormal findings: Secondary | ICD-10-CM

## 2014-05-12 LAB — BASIC METABOLIC PANEL
BUN: 14 mg/dL (ref 6–23)
CALCIUM: 9.2 mg/dL (ref 8.4–10.5)
CHLORIDE: 101 meq/L (ref 96–112)
CO2: 28 mEq/L (ref 19–32)
Creatinine, Ser: 0.6 mg/dL (ref 0.4–1.2)
GFR: 114.97 mL/min (ref >60.00)
Glucose, Bld: 80 mg/dL (ref 70–99)
POTASSIUM: 3.7 meq/L (ref 3.5–5.1)
Sodium: 136 mEq/L (ref 135–145)

## 2014-05-12 LAB — CBC WITH DIFFERENTIAL/PLATELET
BASOS PCT: 0.8 % (ref 0.0–3.0)
Basophils Absolute: 0 10*3/uL (ref 0.0–0.1)
Eosinophils Absolute: 0.3 10*3/uL (ref 0.0–0.7)
Eosinophils Relative: 10 % — ABNORMAL HIGH (ref 0.0–5.0)
HCT: 41.1 % (ref 36.0–46.0)
Hemoglobin: 13.7 g/dL (ref 12.0–15.0)
LYMPHS PCT: 40.5 % (ref 12.0–46.0)
Lymphs Abs: 1.4 10*3/uL (ref 0.7–4.0)
MCHC: 33.4 g/dL (ref 30.0–36.0)
MCV: 96.8 fl (ref 78.0–100.0)
Monocytes Absolute: 0.3 10*3/uL (ref 0.1–1.0)
Monocytes Relative: 7.7 % (ref 3.0–12.0)
NEUTROS ABS: 1.4 10*3/uL (ref 1.4–7.7)
NEUTROS PCT: 41 % — AB (ref 43.0–77.0)
PLATELETS: 203 10*3/uL (ref 150.0–400.0)
RBC: 4.24 Mil/uL (ref 3.87–5.11)
RDW: 12.3 % (ref 11.5–15.5)
WBC: 3.5 10*3/uL — AB (ref 4.0–10.5)

## 2014-05-12 LAB — TSH: TSH: 1.2 u[IU]/mL (ref 0.35–4.50)

## 2014-05-12 LAB — POCT URINALYSIS DIPSTICK
BILIRUBIN UA: NEGATIVE
Glucose, UA: NEGATIVE
Ketones, UA: NEGATIVE
Leukocytes, UA: NEGATIVE
NITRITE UA: NEGATIVE
PH UA: 7.5
Protein, UA: NEGATIVE
RBC UA: NEGATIVE
SPEC GRAV UA: 1.015
UROBILINOGEN UA: 0.2

## 2014-05-12 LAB — HEPATIC FUNCTION PANEL
ALT: 19 U/L (ref 0–35)
AST: 19 U/L (ref 0–37)
Albumin: 4 g/dL (ref 3.5–5.2)
Alkaline Phosphatase: 70 U/L (ref 39–117)
BILIRUBIN DIRECT: 0 mg/dL (ref 0.0–0.3)
BILIRUBIN TOTAL: 0.6 mg/dL (ref 0.2–1.2)
Total Protein: 7.8 g/dL (ref 6.0–8.3)

## 2014-05-12 LAB — LIPID PANEL
CHOLESTEROL: 180 mg/dL (ref 0–200)
HDL: 54.6 mg/dL (ref >39.00)
LDL Cholesterol: 115 mg/dL — ABNORMAL HIGH (ref 0–99)
NonHDL: 125.4
Total CHOL/HDL Ratio: 3
Triglycerides: 51 mg/dL (ref 0.0–149.0)
VLDL: 10.2 mg/dL (ref 0.0–40.0)

## 2014-05-19 ENCOUNTER — Ambulatory Visit (INDEPENDENT_AMBULATORY_CARE_PROVIDER_SITE_OTHER): Payer: BC Managed Care – PPO | Admitting: Family Medicine

## 2014-05-19 ENCOUNTER — Encounter: Payer: Self-pay | Admitting: Family Medicine

## 2014-05-19 VITALS — BP 108/72 | HR 70 | Temp 97.8°F | Ht 61.0 in | Wt 108.0 lb

## 2014-05-19 DIAGNOSIS — Z23 Encounter for immunization: Secondary | ICD-10-CM

## 2014-05-19 DIAGNOSIS — Z0000 Encounter for general adult medical examination without abnormal findings: Secondary | ICD-10-CM

## 2014-05-19 NOTE — Progress Notes (Signed)
Pre visit review using our clinic review tool, if applicable. No additional management support is needed unless otherwise documented below in the visit note. 

## 2014-05-19 NOTE — Addendum Note (Signed)
Addended by: Noe Gens E on: 05/19/2014 10:45 AM   Modules accepted: Orders

## 2014-05-19 NOTE — Patient Instructions (Signed)
Continue regular weightbearing exercise. Continue daily consumption of calcium 1200 mg and vitamin D 1000 international units

## 2014-05-19 NOTE — Progress Notes (Signed)
   Subjective:    Patient ID: Jaime Briggs, female    DOB: 06-Jun-1956, 58 y.o.   MRN: [id number]  HPI Patient here for complete physical. She sees gynecologist regularly for Pap smears and mammograms. She states she had colonoscopy age 1 which was normal with recommended ten-year followup. No history of shingles vaccine yet. Also needs flu vaccine. Nonsmoker. Walks for exercise. She takes regular calcium and vitamin D. Tetanus up-to-date.  No past medical history on file. Past Surgical History  Procedure Laterality Date  . Cholecystectomy      reports that she has never smoked. She does not have any smokeless tobacco history on file. She reports that she drinks about .6 ounces of alcohol per week. She reports that she does not use illicit drugs. family history is not on file. No Known Allergies    Review of Systems  Constitutional: Negative for fever, activity change, appetite change, fatigue and unexpected weight change.  HENT: Negative for ear pain, hearing loss, sore throat and trouble swallowing.   Eyes: Negative for visual disturbance.  Respiratory: Negative for cough and shortness of breath.   Cardiovascular: Negative for chest pain and palpitations.  Gastrointestinal: Negative for abdominal pain, diarrhea, constipation and blood in stool.  Genitourinary: Negative for dysuria and hematuria.  Musculoskeletal: Negative for arthralgias, back pain and myalgias.  Skin: Negative for rash.  Neurological: Negative for dizziness, syncope and headaches.  Hematological: Negative for adenopathy.  Psychiatric/Behavioral: Negative for confusion and dysphoric mood.       Objective:   Physical Exam  Constitutional: She is oriented to person, place, and time. She appears well-developed and well-nourished.  HENT:  Head: Normocephalic and atraumatic.  Eyes: EOM are normal. Pupils are equal, round, and reactive to light.  Neck: Normal range of motion. Neck supple. No thyromegaly present.   Cardiovascular: Normal rate, regular rhythm and normal heart sounds.   No murmur heard. Pulmonary/Chest: Breath sounds normal. No respiratory distress. She has no wheezes. She has no rales.  Abdominal: Soft. Bowel sounds are normal. She exhibits no distension and no mass. There is no tenderness. There is no rebound and no guarding.  Genitourinary:  Per GYN  Musculoskeletal: Normal range of motion. She exhibits no edema.  Lymphadenopathy:    She has no cervical adenopathy.  Neurological: She is alert and oriented to person, place, and time. She displays normal reflexes. No cranial nerve deficit.  Skin: No rash noted.  Psychiatric: She has a normal mood and affect. Her behavior is normal. Judgment and thought content normal.          Assessment & Plan:  Healthy 58 year old female. Patient requesting shingles vaccine. She has no contraindications. Flu vaccine given. Labs reviewed with no major concerns. She will continue regular GYN followup. Continue adequate calcium and vitamin D supplementation. Colonoscopy up to date. Tetanus up-to-date.

## 2015-01-13 ENCOUNTER — Telehealth: Payer: Self-pay

## 2015-01-13 NOTE — Telephone Encounter (Signed)
Pt gets one every year thru[facility] and it's sent to dr.donald pittaway in [facility]

## 2015-03-10 ENCOUNTER — Encounter: Payer: Self-pay | Admitting: *Deleted

## 2017-05-29 DIAGNOSIS — Z23 Encounter for immunization: Secondary | ICD-10-CM | POA: Diagnosis not present

## 2017-09-07 DIAGNOSIS — Z1231 Encounter for screening mammogram for malignant neoplasm of breast: Secondary | ICD-10-CM | POA: Diagnosis not present

## 2018-06-21 DIAGNOSIS — Z23 Encounter for immunization: Secondary | ICD-10-CM | POA: Diagnosis not present

## 2019-04-16 ENCOUNTER — Telehealth: Payer: Self-pay

## 2019-04-16 ENCOUNTER — Telehealth: Payer: Self-pay | Admitting: Family Medicine

## 2019-04-16 NOTE — Telephone Encounter (Signed)
Pt. Requesting PCP to order Alendronate Sodium, that was previously prescribed by her Gynecologist.    Phone call to pt.  Advised since she has not seen Dr. Elease Hashimoto since 05/2014, she will need to schedule appt. for a physical, before any prescriptions can be filled.  Appt. Scheduled for 04/23/2019 @ 8:00 AM.  Pt. Asked about lab work.  Advised she should come to the appt. Fasting from 12 midnight prior to appt., and could go to lab following her appt. For any lab tests that Dr. Elease Hashimoto may order.  Verb. Understanding.

## 2019-04-16 NOTE — Telephone Encounter (Signed)
Please advise 

## 2019-04-16 NOTE — Telephone Encounter (Signed)
I have not seen her since 2015!Marland Kitchen   [facility].   We should offer follow up for CPE.

## 2019-04-16 NOTE — Telephone Encounter (Signed)
Called patient and she spoke to someone else that set her up a CPE on 04/23/19 and patient will go over with Dr. Elease Hashimoto at this time.

## 2019-04-16 NOTE — Telephone Encounter (Signed)
Copied from [place] [id number]. Topic: Referral - Request for Referral >> Apr 16, 2019 11:35 AM Sheran Luz wrote: Has patient seen PCP for this complaint? Yes Patient is requesting a referral for colonoscopy, she prefers [facility].

## 2019-04-16 NOTE — Telephone Encounter (Signed)
Medication: alendronate sodium- 70 mg   Patient is requesting a refill of this medication (unable to find on chart) patient states that this was previously prescribed by gynecologist that has since retired.    Pharmacy:  [id number] - [place], [place] [phone] (Phone) [phone] (Fax)

## 2019-04-23 ENCOUNTER — Encounter: Payer: Self-pay | Admitting: Family Medicine

## 2019-04-23 ENCOUNTER — Other Ambulatory Visit: Payer: Self-pay

## 2019-04-23 ENCOUNTER — Ambulatory Visit (INDEPENDENT_AMBULATORY_CARE_PROVIDER_SITE_OTHER): Payer: BLUE CROSS/BLUE SHIELD | Admitting: Family Medicine

## 2019-04-23 VITALS — BP 110/64 | HR 67 | Temp 97.9°F | Ht 61.0 in | Wt 114.8 lb

## 2019-04-23 DIAGNOSIS — Z780 Asymptomatic menopausal state: Secondary | ICD-10-CM

## 2019-04-23 DIAGNOSIS — Z23 Encounter for immunization: Secondary | ICD-10-CM

## 2019-04-23 DIAGNOSIS — Z0000 Encounter for general adult medical examination without abnormal findings: Secondary | ICD-10-CM

## 2019-04-23 LAB — BASIC METABOLIC PANEL
BUN: 15 mg/dL (ref 6–23)
CO2: 31 mEq/L (ref 19–32)
Calcium: 9.2 mg/dL (ref 8.4–10.5)
Chloride: 103 mEq/L (ref 96–112)
Creatinine, Ser: 0.66 mg/dL (ref 0.40–1.20)
GFR: 89.88 mL/min (ref >60.00)
Glucose, Bld: 80 mg/dL (ref 70–99)
Potassium: 4.3 mEq/L (ref 3.5–5.1)
Sodium: 141 mEq/L (ref 135–145)

## 2019-04-23 LAB — HEPATIC FUNCTION PANEL
ALT: 14 U/L (ref 0–35)
AST: 18 U/L (ref 0–37)
Albumin: 4.2 g/dL (ref 3.5–5.2)
Alkaline Phosphatase: 47 U/L (ref 39–117)
Bilirubin, Direct: 0.1 mg/dL (ref 0.0–0.3)
Total Bilirubin: 0.5 mg/dL (ref 0.2–1.2)
Total Protein: 7 g/dL (ref 6.0–8.3)

## 2019-04-23 LAB — CBC WITH DIFFERENTIAL/PLATELET
Basophils Absolute: 0 10*3/uL (ref 0.0–0.1)
Basophils Relative: 0.9 % (ref 0.0–3.0)
Eosinophils Absolute: 0.4 10*3/uL (ref 0.0–0.7)
Eosinophils Relative: 10.5 % — ABNORMAL HIGH (ref 0.0–5.0)
HCT: 41 % (ref 36.0–46.0)
Hemoglobin: 13.7 g/dL (ref 12.0–15.0)
Lymphocytes Relative: 32 % (ref 12.0–46.0)
Lymphs Abs: 1.3 10*3/uL (ref 0.7–4.0)
MCHC: 33.5 g/dL (ref 30.0–36.0)
MCV: 95.1 fl (ref 78.0–100.0)
Monocytes Absolute: 0.4 10*3/uL (ref 0.1–1.0)
Monocytes Relative: 9.3 % (ref 3.0–12.0)
Neutro Abs: 1.9 10*3/uL (ref 1.4–7.7)
Neutrophils Relative %: 47.3 % (ref 43.0–77.0)
Platelets: 189 10*3/uL (ref 150.0–400.0)
RBC: 4.31 Mil/uL (ref 3.87–5.11)
RDW: 12.5 % (ref 11.5–15.5)
WBC: 4 10*3/uL (ref 4.0–10.5)

## 2019-04-23 LAB — LIPID PANEL
Cholesterol: 166 mg/dL (ref 0–200)
HDL: 58.9 mg/dL (ref >39.00)
LDL Cholesterol: 95 mg/dL (ref 0–99)
NonHDL: 106.91
Total CHOL/HDL Ratio: 3
Triglycerides: 59 mg/dL (ref 0.0–149.0)
VLDL: 11.8 mg/dL (ref 0.0–40.0)

## 2019-04-23 LAB — TSH: TSH: 1.21 u[IU]/mL (ref 0.35–4.50)

## 2019-04-23 NOTE — Progress Notes (Signed)
Subjective:     Patient ID: Jaime Briggs, female   DOB: 04-16-1956, 63 y.o.   MRN: [id number]  HPI Patient is seen for physical exam.  She seen gynecologist in the past but her gynecologist has retired.  She has reported history of osteopenia.  She think she has been on Fosamax for 5 years.  She thinks her last DEXA scan was 2 years ago.  She is due for flu shot and Tdap.  She turns 63 in a couple days.  She is also due for repeat colonoscopy.  Her last was reportedly 10 years ago.  She has not had hepatitis C screening.  She has seen gynecology for Pap smears and mammograms.  She states she had mammogram last summer.  Her Pap smear is up-to-date.  She had previous Zostavax.  Non-smoker.  Walks for exercise.  Takes multivitamin and calcium  History reviewed. No pertinent past medical history. Past Surgical History:  Procedure Laterality Date  . CHOLECYSTECTOMY      reports that she has never smoked. She has never used smokeless tobacco. She reports current alcohol use of about 1.0 standard drinks of alcohol per week. She reports that she does not use drugs. family history is not on file. No Known Allergies   Review of Systems  Constitutional: Negative for activity change, appetite change, fatigue, fever and unexpected weight change.  HENT: Negative for ear pain, hearing loss, sore throat and trouble swallowing.   Eyes: Negative for visual disturbance.  Respiratory: Negative for cough and shortness of breath.   Cardiovascular: Negative for chest pain and palpitations.  Gastrointestinal: Negative for abdominal pain, blood in stool, constipation and diarrhea.  Genitourinary: Negative for dysuria and hematuria.  Musculoskeletal: Negative for arthralgias, back pain and myalgias.  Skin: Negative for rash.  Neurological: Negative for dizziness, syncope and headaches.  Hematological: Negative for adenopathy.  Psychiatric/Behavioral: Negative for confusion and dysphoric mood.        Objective:   Physical Exam Constitutional:      Appearance: She is well-developed.  HENT:     Head: Normocephalic and atraumatic.  Eyes:     Pupils: Pupils are equal, round, and reactive to light.  Neck:     Musculoskeletal: Normal range of motion and neck supple.     Thyroid: No thyromegaly.  Cardiovascular:     Rate and Rhythm: Normal rate and regular rhythm.     Heart sounds: Normal heart sounds. No murmur.  Pulmonary:     Effort: No respiratory distress.     Breath sounds: Normal breath sounds. No wheezing or rales.  Abdominal:     General: Bowel sounds are normal. There is no distension.     Palpations: Abdomen is soft. There is no mass.     Tenderness: There is no abdominal tenderness. There is no guarding or rebound.  Musculoskeletal: Normal range of motion.  Lymphadenopathy:     Cervical: No cervical adenopathy.  Skin:    Findings: No rash.  Neurological:     Mental Status: She is alert and oriented to person, place, and time.     Cranial Nerves: No cranial nerve deficit.     Deep Tendon Reflexes: Reflexes normal.  Psychiatric:        Behavior: Behavior normal.        Thought Content: Thought content normal.        Judgment: Judgment normal.        Assessment:     Physical exam.  Generally healthy 63 year old  female.  She has reported history of osteopenia.  We discussed multiple health maintenance issues as below    Plan:     -Continue regular weightbearing exercise and calcium and vitamin D - set up repeat DEXA scan -We recommended that she consider holiday off Fosamax since she has been on this reportedly for 5 years -Flu vaccine and Tdap given -We discussed Pneumovax.  She is technically not 63 yet but would recommend this within the next few months -Check hepatitis C antibody -Consider Shingrix vaccine.  She will check on insurance coverage -Set up repeat colonoscopy -Obtain screening labs which she is not had in several years  Eulas Post  MD [facility]

## 2019-04-23 NOTE — Addendum Note (Signed)
Addended by: Anibal Henderson on: 04/23/2019 08:49 AM   Modules accepted: Orders

## 2019-04-23 NOTE — Patient Instructions (Signed)

## 2019-04-24 LAB — HEPATITIS C ANTIBODY
Hepatitis C Ab: NONREACTIVE
SIGNAL TO CUT-OFF: 0.1 (ref <1.00)

## 2019-05-14 ENCOUNTER — Ambulatory Visit (INDEPENDENT_AMBULATORY_CARE_PROVIDER_SITE_OTHER)
Admission: RE | Admit: 2019-05-14 | Discharge: 2019-05-14 | Disposition: A | Discharge status: Home / Self Care | Payer: BC Managed Care – PPO | Source: Ambulatory Visit | Attending: Family Medicine | Admitting: Family Medicine

## 2019-05-14 ENCOUNTER — Other Ambulatory Visit: Payer: Self-pay

## 2019-05-14 DIAGNOSIS — Z780 Asymptomatic menopausal state: Secondary | ICD-10-CM

## 2019-05-16 DIAGNOSIS — Z780 Asymptomatic menopausal state: Secondary | ICD-10-CM | POA: Diagnosis not present

## 2019-05-16 DIAGNOSIS — Z1231 Encounter for screening mammogram for malignant neoplasm of breast: Secondary | ICD-10-CM | POA: Diagnosis not present

## 2019-06-12 ENCOUNTER — Telehealth: Payer: Self-pay | Admitting: Family Medicine

## 2019-06-12 NOTE — Telephone Encounter (Signed)
Spoke to the pt today and gave her the results of the Dexa Scan.  She would like to know if Dr. Elease Hashimoto would like for her to continue taking Fosamax due to the osteopenia.  She is completely out of medication.  [facility] [place]  Advised that someone from the office will give her a call back.

## 2019-06-13 NOTE — Telephone Encounter (Signed)
Spoke with pt an went over Dr. Barbie Banner message. She understood and stated she could wait to hear from. Dr. Elease Hashimoto.

## 2019-06-13 NOTE — Telephone Encounter (Signed)
That is a question that Dr. Elease Hashimoto will need to address. She can safely wait until next week for his answer.

## 2019-06-15 NOTE — Telephone Encounter (Signed)
Recommend 2 year "holiday" off Fosamax as she has been on this for about 5 years.  Continue with regular weight bearing exercise and daily calcium and vit D

## 2019-06-17 NOTE — Telephone Encounter (Signed)
Spoke with pt and informed her of Dr. Erick Blinks message. Pt understood and had no additional questions at this time.

## 2019-06-27 ENCOUNTER — Other Ambulatory Visit: Payer: Self-pay

## 2019-06-27 DIAGNOSIS — Z20822 Contact with and (suspected) exposure to covid-19: Secondary | ICD-10-CM

## 2019-06-29 LAB — NOVEL CORONAVIRUS, NAA: SARS-CoV-2, NAA: NOT DETECTED

## 2019-11-21 ENCOUNTER — Telehealth: Payer: Self-pay | Admitting: Family Medicine

## 2019-11-21 DIAGNOSIS — Z1211 Encounter for screening for malignant neoplasm of colon: Secondary | ICD-10-CM

## 2019-11-21 NOTE — Telephone Encounter (Signed)
Pt would like a referral to have a colonoscopy. Pt had an appt last year and cancelled because of COVID. Thanks

## 2019-11-22 NOTE — Telephone Encounter (Signed)
Unable to leave voicemail but referral has bene placed

## 2019-11-22 NOTE — Telephone Encounter (Signed)
She probably does not need referral since she is established but okay to set up referral if she needs this

## 2019-11-22 NOTE — Addendum Note (Signed)
Addended by: Modena Morrow R on: 11/22/2019 01:14 PM   Modules accepted: Orders

## 2019-11-22 NOTE — Telephone Encounter (Signed)
Please advise 

## 2019-12-27 ENCOUNTER — Encounter: Payer: Self-pay | Admitting: Gastroenterology

## 2020-01-28 ENCOUNTER — Other Ambulatory Visit: Payer: Self-pay

## 2020-01-28 ENCOUNTER — Ambulatory Visit (AMBULATORY_SURGERY_CENTER): Payer: Self-pay | Admitting: *Deleted

## 2020-01-28 VITALS — Ht 61.0 in | Wt 115.0 lb

## 2020-01-28 DIAGNOSIS — Z1211 Encounter for screening for malignant neoplasm of colon: Secondary | ICD-10-CM

## 2020-01-28 MED ORDER — SUPREP BOWEL PREP KIT 17.5-3.13-1.6 GM/177ML PO SOLN: 1.0000 | Freq: Once | ORAL | 0 refills | Status: AC

## 2020-01-28 NOTE — Progress Notes (Signed)

## 2020-01-31 ENCOUNTER — Encounter: Payer: Self-pay | Admitting: Gastroenterology

## 2020-02-18 ENCOUNTER — Ambulatory Visit (AMBULATORY_SURGERY_CENTER): Payer: BC Managed Care – PPO | Admitting: Gastroenterology

## 2020-02-18 ENCOUNTER — Other Ambulatory Visit: Payer: Self-pay

## 2020-02-18 ENCOUNTER — Encounter: Payer: Self-pay | Admitting: Gastroenterology

## 2020-02-18 VITALS — BP 122/75 | HR 65 | Temp 97.4°F | Resp 15 | Ht 61.0 in | Wt 115.0 lb

## 2020-02-18 DIAGNOSIS — Z1211 Encounter for screening for malignant neoplasm of colon: Secondary | ICD-10-CM

## 2020-02-18 DIAGNOSIS — K635 Polyp of colon: Secondary | ICD-10-CM

## 2020-02-18 DIAGNOSIS — D122 Benign neoplasm of ascending colon: Secondary | ICD-10-CM

## 2020-02-18 MED ORDER — SODIUM CHLORIDE 0.9 % IV SOLN: 500.0000 mL | Freq: Once | INTRAVENOUS | Status: DC

## 2020-02-18 NOTE — Progress Notes (Signed)
pt tolerated well. VSS. awake and to recovery. Report given to RN.  

## 2020-02-18 NOTE — Progress Notes (Signed)
Pt's states no medical or surgical changes since previsit or office visit. 

## 2020-02-18 NOTE — Op Note (Signed)
[facility] Patient Name: Jaime Briggs Procedure Date: 02/18/2020 9:59 AM MRN: [id number] Endoscopist: Mauri Pole , MD Age: 64 Referring MD:  Date of Birth: August 28, 1955 Gender: Female Account #: [id number] Procedure:                Colonoscopy Indications:              Screening for colorectal malignant neoplasm Medicines:                Monitored Anesthesia Care Procedure:                Pre-Anesthesia Assessment:                           - Prior to the procedure, a History and Physical                            was performed, and patient medications and                            allergies were reviewed. The patient's tolerance of                            previous anesthesia was also reviewed. The risks                            and benefits of the procedure and the sedation                            options and risks were discussed with the patient.                            All questions were answered, and informed consent                            was obtained. Prior Anticoagulants: The patient has                            taken no previous anticoagulant or antiplatelet                            agents. ASA Grade Assessment: I - A normal, healthy                            patient. After reviewing the risks and benefits,                            the patient was deemed in satisfactory condition to                            undergo the procedure.                           After obtaining informed consent, the colonoscope  was passed under direct vision. Throughout the                            procedure, the patient's blood pressure, pulse, and                            oxygen saturations were monitored continuously. The                            Colonoscope was introduced through the anus and                            advanced to the the cecum, identified by                            appendiceal orifice and ileocecal  valve. The                            colonoscopy was performed without difficulty. The                            patient tolerated the procedure well. The quality                            of the bowel preparation was adequate. The                            ileocecal valve, appendiceal orifice, and rectum                            were photographed. Scope In: 10:09:10 AM Scope Out: 10:34:23 AM Scope Withdrawal Time: 0 hours 14 minutes 25 seconds  Total Procedure Duration: 0 hours 25 minutes 13 seconds  Findings:                 The perianal and digital rectal examinations were                            normal.                           A 7 mm polyp was found in the ascending colon. The                            polyp was flat. The polyp was removed with a cold                            snare. Resection and retrieval were complete.                           A few small-mouthed diverticula were found in the                            sigmoid colon.  Non-bleeding internal hemorrhoids were found during                            retroflexion. The hemorrhoids were small.                           The exam was otherwise without abnormality. Complications:            No immediate complications. Estimated Blood Loss:     Estimated blood loss was minimal. Impression:               - One 7 mm polyp in the ascending colon, removed                            with a cold snare. Resected and retrieved.                           - Diverticulosis in the sigmoid colon.                           - Non-bleeding internal hemorrhoids.                           - The examination was otherwise normal. Recommendation:           - Patient has a contact number available for                            emergencies. The signs and symptoms of potential                            delayed complications were discussed with the                            patient. Return to normal  activities tomorrow.                            Written discharge instructions were provided to the                            patient.                           - Resume previous diet.                           - Continue present medications.                           - Await pathology results.                           - Repeat colonoscopy in 5-10 years for surveillance                            based on pathology results. Mauri Pole, MD 02/18/2020 10:39:27 AM This report has been signed electronically.

## 2020-02-18 NOTE — Progress Notes (Signed)
Called to room to assist during endoscopic procedure.  Patient ID and intended procedure confirmed with present staff. Received instructions for my participation in the procedure from the performing physician.  

## 2020-02-18 NOTE — Progress Notes (Signed)
No problems noted in the recovery room. maw 

## 2020-02-18 NOTE — Patient Instructions (Addendum)
Handouts were given to you on polyps, diverticulosis, and hemorrhoids. You may resume your current medications today. Await biopsy results this usually takes 2-3 weeks.  Repeat colonoscopy in 5-10 years for surveillance based on pathology results. Please call if any questions or concerns.    YOU HAD AN ENDOSCOPIC PROCEDURE TODAY AT [facility]:   Refer to the procedure report that was given to you for any specific questions about what was found during the examination.  If the procedure report does not answer your questions, please call your gastroenterologist to clarify.  If you requested that your care partner not be given the details of your procedure findings, then the procedure report has been included in a sealed envelope for you to review at your convenience later.  YOU SHOULD EXPECT: Some feelings of bloating in the abdomen. Passage of more gas than usual.  Walking can help get rid of the air that was put into your GI tract during the procedure and reduce the bloating. If you had a lower endoscopy (such as a colonoscopy or flexible sigmoidoscopy) you may notice spotting of blood in your stool or on the toilet paper. If you underwent a bowel prep for your procedure, you may not have a normal bowel movement for a few days.  Please Note:  You might notice some irritation and congestion in your nose or some drainage.  This is from the oxygen used during your procedure.  There is no need for concern and it should clear up in a day or so.  SYMPTOMS TO REPORT IMMEDIATELY:   Following lower endoscopy (colonoscopy or flexible sigmoidoscopy):  Excessive amounts of blood in the stool  Significant tenderness or worsening of abdominal pains  Swelling of the abdomen that is new, acute  Fever of 100F or higher   For urgent or emergent issues, a gastroenterologist can be reached at any hour by calling [phone]. Do not use MyChart messaging for urgent concerns.    DIET:  We  do recommend a small meal at first, but then you may proceed to your regular diet.  Drink plenty of fluids but you should avoid alcoholic beverages for 24 hours.  ACTIVITY:  You should plan to take it easy for the rest of today and you should NOT DRIVE or use heavy machinery until tomorrow (because of the sedation medicines used during the test).    FOLLOW UP: Our staff will call the number listed on your records 48-72 hours following your procedure to check on you and address any questions or concerns that you may have regarding the information given to you following your procedure. If we do not reach you, we will leave a message.  We will attempt to reach you two times.  During this call, we will ask if you have developed any symptoms of COVID 19. If you develop any symptoms (ie: fever, flu-like symptoms, shortness of breath, cough etc.) before then, please call [phone].  If you test positive for Covid 19 in the 2 weeks post procedure, please call and report this information to [place].    If any biopsies were taken you will be contacted by phone or by letter within the next 1-3 weeks.  Please call us at [phone] if you have not heard about the biopsies in 3 weeks.    SIGNATURES/CONFIDENTIALITY: You and/or your care partner have signed paperwork which will be entered into your electronic medical record.  These signatures attest to the fact that that the  information above on your After Visit Summary has been reviewed and is understood.  Full responsibility of the confidentiality of this discharge information lies with you and/or your care-partner.

## 2020-02-20 ENCOUNTER — Telehealth: Payer: Self-pay | Admitting: *Deleted

## 2020-02-20 ENCOUNTER — Telehealth: Payer: Self-pay

## 2020-02-20 NOTE — Telephone Encounter (Signed)
First attempt, left VM.  

## 2020-02-20 NOTE — Telephone Encounter (Signed)
  Follow up Call-  Call back number 02/18/2020  Post procedure Call Back phone  # [phone]  Permission to leave phone message Yes  Some recent data might be hidden     Patient questions:  Do you have a fever, pain , or abdominal swelling? No. Pain Score  0 *  Have you tolerated food without any problems? Yes.    Have you been able to return to your normal activities? Yes.    Do you have any questions about your discharge instructions: Diet   No. Medications  No. Follow up visit  No.  Do you have questions or concerns about your Care? No.  Actions: * If pain score is 4 or above: No action needed, pain <4.  1. Have you developed a fever since your procedure? no  2.   Have you had an respiratory symptoms (SOB or cough) since your procedure? no  3.   Have you tested positive for COVID 19 since your procedure no  4.   Have you had any family members/close contacts diagnosed with the COVID 19 since your procedure?  no   If yes to any of these questions please route to Joylene John, RN and Erenest Rasher, RN

## 2020-02-27 ENCOUNTER — Encounter: Payer: Self-pay | Admitting: Gastroenterology

## 2020-06-03 ENCOUNTER — Telehealth: Payer: Self-pay | Admitting: *Deleted

## 2020-06-03 NOTE — Telephone Encounter (Signed)
Patient called wanting to know if she should get the booster, patient states she had both of pfizer injections. [phone]

## 2021-05-12 ENCOUNTER — Ambulatory Visit: Payer: BC Managed Care – PPO | Admitting: Family Medicine

## 2021-05-12 ENCOUNTER — Other Ambulatory Visit: Payer: Self-pay

## 2021-05-12 ENCOUNTER — Encounter: Payer: Self-pay | Admitting: Family Medicine

## 2021-05-12 VITALS — BP 120/62 | HR 76 | Temp 98.3°F | Ht 61.0 in | Wt 110.1 lb

## 2021-05-12 DIAGNOSIS — Z23 Encounter for immunization: Secondary | ICD-10-CM | POA: Diagnosis not present

## 2021-05-12 DIAGNOSIS — Z0000 Encounter for general adult medical examination without abnormal findings: Secondary | ICD-10-CM

## 2021-05-12 DIAGNOSIS — Z780 Asymptomatic menopausal state: Secondary | ICD-10-CM

## 2021-05-12 LAB — LIPID PANEL
Cholesterol: 159 mg/dL (ref 0–200)
HDL: 66.5 mg/dL (ref >39.00)
LDL Cholesterol: 84 mg/dL (ref 0–99)
NonHDL: 92.65
Total CHOL/HDL Ratio: 2
Triglycerides: 45 mg/dL (ref 0.0–149.0)
VLDL: 9 mg/dL (ref 0.0–40.0)

## 2021-05-12 LAB — BASIC METABOLIC PANEL
BUN: 12 mg/dL (ref 6–23)
CO2: 28 mEq/L (ref 19–32)
Calcium: 9.2 mg/dL (ref 8.4–10.5)
Chloride: 102 mEq/L (ref 96–112)
Creatinine, Ser: 0.62 mg/dL (ref 0.40–1.20)
GFR: 92.39 mL/min (ref >60.00)
Glucose, Bld: 77 mg/dL (ref 70–99)
Potassium: 3.4 mEq/L — ABNORMAL LOW (ref 3.5–5.1)
Sodium: 139 mEq/L (ref 135–145)

## 2021-05-12 LAB — CBC WITH DIFFERENTIAL/PLATELET
Basophils Absolute: 0 10*3/uL (ref 0.0–0.1)
Basophils Relative: 0.8 % (ref 0.0–3.0)
Eosinophils Absolute: 0.1 10*3/uL (ref 0.0–0.7)
Eosinophils Relative: 2.1 % (ref 0.0–5.0)
HCT: 41.3 % (ref 36.0–46.0)
Hemoglobin: 13.6 g/dL (ref 12.0–15.0)
Lymphocytes Relative: 16.5 % (ref 12.0–46.0)
Lymphs Abs: 0.7 10*3/uL (ref 0.7–4.0)
MCHC: 32.8 g/dL (ref 30.0–36.0)
MCV: 96.5 fl (ref 78.0–100.0)
Monocytes Absolute: 0.7 10*3/uL (ref 0.1–1.0)
Monocytes Relative: 14.7 % — ABNORMAL HIGH (ref 3.0–12.0)
Neutro Abs: 3 10*3/uL (ref 1.4–7.7)
Neutrophils Relative %: 65.9 % (ref 43.0–77.0)
Platelets: 195 10*3/uL (ref 150.0–400.0)
RBC: 4.28 Mil/uL (ref 3.87–5.11)
RDW: 13 % (ref 11.5–15.5)
WBC: 4.5 10*3/uL (ref 4.0–10.5)

## 2021-05-12 LAB — HEPATIC FUNCTION PANEL
ALT: 13 U/L (ref 0–35)
AST: 18 U/L (ref 0–37)
Albumin: 4.4 g/dL (ref 3.5–5.2)
Alkaline Phosphatase: 52 U/L (ref 39–117)
Bilirubin, Direct: 0.1 mg/dL (ref 0.0–0.3)
Total Bilirubin: 0.5 mg/dL (ref 0.2–1.2)
Total Protein: 7.6 g/dL (ref 6.0–8.3)

## 2021-05-12 LAB — TSH: TSH: 0.54 u[IU]/mL (ref 0.35–5.50)

## 2021-05-12 NOTE — Progress Notes (Signed)
Established Patient Office Visit  Subjective:  Patient ID: Jaime Briggs, female    DOB: 1955/09/11  Age: 65 y.o. MRN: [id number]  CC:  Chief Complaint  Patient presents with   Annual Exam    HPI SHRADDHA Briggs presents for well visit.  She is got most of this from her gynecologist in the past but her gynecologist retired couple years ago.  Not clear whether she has history of osteoporosis or osteopenia but was treated with Fosamax from age [age].  Her last DEXA scan 2 years ago showed osteopenia.  Generally very healthy.  She walks most days of the week and is about a hour a day good balance with no recent falls.  Health maintenance reviewed:  -Needs flu vaccine -No history of pneumonia vaccine -Mammogram 12/01/2020 -Colonoscopy due 7/26 -Tetanus due 2030 -Previous hepatitis C screen negative -Last DEXA scan 2 years ago -Had previous Zostavax but no history of Shingrix  Family history and social history reviewed with no significant changes.  Never smoked.    No past medical history on file.  Past Surgical History:  Procedure Laterality Date   CHOLECYSTECTOMY     COLONOSCOPY  2010   10-11 yrs ago- normal per pt     Family History  Problem Relation Age of Onset   Colon polyps Neg Hx    Colon cancer Neg Hx    Esophageal cancer Neg Hx    Rectal cancer Neg Hx    Stomach cancer Neg Hx     Social History   Socioeconomic History   Marital status: Married    Spouse name: Not on file   Number of children: Not on file   Years of education: Not on file   Highest education level: Not on file  Occupational History   Not on file  Tobacco Use   Smoking status: Never   Smokeless tobacco: Never  Vaping Use   Vaping Use: Never used  Substance and Sexual Activity   Alcohol use: Yes    Alcohol/week: 1.0 standard drink    Types: 1 Glasses of wine per week    Comment: occasionally   Drug use: No   Sexual activity: Not on file  Other Topics Concern   Not on file   Social History Narrative   Not on file   Social Determinants of Health   Financial Resource Strain: Not on file  Food Insecurity: Not on file  Transportation Needs: Not on file  Physical Activity: Not on file  Stress: Not on file  Social Connections: Not on file  Intimate Partner Violence: Not on file    Outpatient Medications Prior to Visit  Medication Sig Dispense Refill   alendronate (FOSAMAX) 70 MG tablet Take 70 mg by mouth once a week. Take with a full glass of water on an empty stomach.     Calcium Carbonate-Vitamin D (CALCIUM + D PO) Take 2 tablets by mouth daily.     fish oil-omega-3 fatty acids 1000 MG capsule Take 1 g by mouth daily.     Multiple Vitamin (MULTIVITAMIN WITH MINERALS) TABS Take 1 tablet by mouth daily.     No facility-administered medications prior to visit.    No Known Allergies  ROS Review of Systems  Constitutional:  Negative for activity change, appetite change, fatigue, fever and unexpected weight change.  HENT:  Negative for ear pain, hearing loss, sore throat and trouble swallowing.   Eyes:  Negative for visual disturbance.  Respiratory:  Negative for  cough and shortness of breath.   Cardiovascular:  Negative for chest pain and palpitations.  Gastrointestinal:  Negative for abdominal pain, blood in stool, constipation and diarrhea.  Genitourinary:  Negative for dysuria and hematuria.  Musculoskeletal:  Negative for arthralgias, back pain and myalgias.  Skin:  Negative for rash.  Neurological:  Negative for dizziness, syncope and headaches.  Hematological:  Negative for adenopathy.  Psychiatric/Behavioral:  Negative for confusion and dysphoric mood.      Objective:    Physical Exam Constitutional:      Appearance: Normal appearance. She is well-developed.  HENT:     Head: Normocephalic and atraumatic.  Eyes:     Pupils: Pupils are equal, round, and reactive to light.  Neck:     Thyroid: No thyromegaly.  Cardiovascular:     Rate  and Rhythm: Normal rate and regular rhythm.     Heart sounds: Normal heart sounds. No murmur heard. Pulmonary:     Effort: No respiratory distress.     Breath sounds: Normal breath sounds. No wheezing or rales.  Abdominal:     General: Bowel sounds are normal. There is no distension.     Palpations: Abdomen is soft. There is no mass.     Tenderness: There is no abdominal tenderness. There is no guarding or rebound.  Musculoskeletal:        General: Normal range of motion.     Cervical back: Normal range of motion and neck supple.  Lymphadenopathy:     Cervical: No cervical adenopathy.  Skin:    Findings: No rash.  Neurological:     Mental Status: She is alert and oriented to person, place, and time.     Cranial Nerves: No cranial nerve deficit.  Psychiatric:        Behavior: Behavior normal.        Thought Content: Thought content normal.        Judgment: Judgment normal.    BP 120/62 (BP Location: Left Arm, Patient Position: Sitting, Cuff Size: Normal)   Pulse 76   Temp 98.3 F (36.8 C) (Oral)   Ht 5\' 1"  (1.549 m)   Wt 110 lb 1.6 oz (49.9 kg)   SpO2 100%   BMI 20.80 kg/m  Wt Readings from Last 3 Encounters:  05/12/21 110 lb 1.6 oz (49.9 kg)  02/18/20 115 lb (52.2 kg)  01/28/20 115 lb (52.2 kg)     Health Maintenance Due  Topic Date Due   COVID-19 Vaccine (1) Never done   Zoster Vaccines- Shingrix (1 of 2) Never done    There are no preventive care reminders to display for this patient.  Lab Results  Component Value Date   TSH 1.21 04/23/2019   Lab Results  Component Value Date   WBC 4.0 04/23/2019   HGB 13.7 04/23/2019   HCT 41.0 04/23/2019   MCV 95.1 04/23/2019   PLT 189.0 04/23/2019   Lab Results  Component Value Date   NA 141 04/23/2019   K 4.3 04/23/2019   CO2 31 04/23/2019   GLUCOSE 80 04/23/2019   BUN 15 04/23/2019   CREATININE 0.66 04/23/2019   BILITOT 0.5 04/23/2019   ALKPHOS 47 04/23/2019   AST 18 04/23/2019   ALT 14 04/23/2019   PROT  7.0 04/23/2019   ALBUMIN 4.2 04/23/2019   CALCIUM 9.2 04/23/2019   GFR 89.88 04/23/2019   Lab Results  Component Value Date   CHOL 166 04/23/2019   Lab Results  Component Value Date  HDL 58.90 04/23/2019   Lab Results  Component Value Date   LDLCALC 95 04/23/2019   Lab Results  Component Value Date   TRIG 59.0 04/23/2019   Lab Results  Component Value Date   CHOLHDL 3 04/23/2019   No results found for: HGBA1C    Assessment & Plan:   Problem List Items Addressed This Visit   None Visit Diagnoses     Physical exam    -  Primary   Relevant Orders   Basic metabolic panel   Lipid panel   CBC with Differential/Platelet   TSH   Hepatic function panel   Postmenopausal       Relevant Orders   DG Bone Density   Need for immunization against influenza       Relevant Orders   Flu Vaccine QUAD High Dose(Fluad) (Completed)   Need for pneumococcal vaccination       Relevant Orders   Pneumococcal conjugate vaccine 20-valent (Prevnar 20) (Completed)     -Flu vaccine given -Prevnar 20 given -Discussed Shingrix vaccine.  She will wait at this time to check on insurance coverage -Set up repeat DEXA -Other health maintenance including mammogram, tetanus, colonoscopy up-to-date -Continue regular weightbearing exercise -Obtain screening labs as above  No orders of the defined types were placed in this encounter.   Follow-up: No follow-ups on file.    Carolann Littler, MD

## 2021-05-12 NOTE — Patient Instructions (Signed)
Set up DEXA scan - check with schedulers up front.

## 2022-05-30 ENCOUNTER — Ambulatory Visit (INDEPENDENT_AMBULATORY_CARE_PROVIDER_SITE_OTHER): Payer: Medicare Other | Admitting: Family Medicine

## 2022-05-30 ENCOUNTER — Encounter: Payer: Self-pay | Admitting: Family Medicine

## 2022-05-30 VITALS — BP 136/70 | HR 73 | Temp 97.7°F | Ht 59.84 in | Wt 102.8 lb

## 2022-05-30 DIAGNOSIS — Z780 Asymptomatic menopausal state: Secondary | ICD-10-CM

## 2022-05-30 DIAGNOSIS — Z0000 Encounter for general adult medical examination without abnormal findings: Secondary | ICD-10-CM | POA: Diagnosis not present

## 2022-05-30 DIAGNOSIS — M8580 Other specified disorders of bone density and structure, unspecified site: Secondary | ICD-10-CM

## 2022-05-30 DIAGNOSIS — Z23 Encounter for immunization: Secondary | ICD-10-CM | POA: Diagnosis not present

## 2022-05-30 LAB — CBC WITH DIFFERENTIAL/PLATELET
Basophils Absolute: 0 10*3/uL (ref 0.0–0.1)
Basophils Relative: 0.9 % (ref 0.0–3.0)
Eosinophils Absolute: 0.2 10*3/uL (ref 0.0–0.7)
Eosinophils Relative: 5.5 % — ABNORMAL HIGH (ref 0.0–5.0)
HCT: 42 % (ref 36.0–46.0)
Hemoglobin: 14 g/dL (ref 12.0–15.0)
Lymphocytes Relative: 36.8 % (ref 12.0–46.0)
Lymphs Abs: 1.3 10*3/uL (ref 0.7–4.0)
MCHC: 33.4 g/dL (ref 30.0–36.0)
MCV: 95.9 fl (ref 78.0–100.0)
Monocytes Absolute: 0.3 10*3/uL (ref 0.1–1.0)
Monocytes Relative: 9.1 % (ref 3.0–12.0)
Neutro Abs: 1.6 10*3/uL (ref 1.4–7.7)
Neutrophils Relative %: 47.7 % (ref 43.0–77.0)
Platelets: 204 10*3/uL (ref 150.0–400.0)
RBC: 4.39 Mil/uL (ref 3.87–5.11)
RDW: 12.7 % (ref 11.5–15.5)
WBC: 3.4 10*3/uL — ABNORMAL LOW (ref 4.0–10.5)

## 2022-05-30 LAB — LIPID PANEL
Cholesterol: 170 mg/dL (ref 0–200)
HDL: 68.2 mg/dL (ref >39.00)
LDL Cholesterol: 94 mg/dL (ref 0–99)
NonHDL: 102.26
Total CHOL/HDL Ratio: 2
Triglycerides: 43 mg/dL (ref 0.0–149.0)
VLDL: 8.6 mg/dL (ref 0.0–40.0)

## 2022-05-30 LAB — TSH: TSH: 1.03 u[IU]/mL (ref 0.35–5.50)

## 2022-05-30 LAB — BASIC METABOLIC PANEL
BUN: 14 mg/dL (ref 6–23)
CO2: 30 mEq/L (ref 19–32)
Calcium: 9.7 mg/dL (ref 8.4–10.5)
Chloride: 103 mEq/L (ref 96–112)
Creatinine, Ser: 0.6 mg/dL (ref 0.40–1.20)
GFR: 92.44 mL/min (ref >60.00)
Glucose, Bld: 90 mg/dL (ref 70–99)
Potassium: 3.8 mEq/L (ref 3.5–5.1)
Sodium: 140 mEq/L (ref 135–145)

## 2022-05-30 LAB — HEPATIC FUNCTION PANEL
ALT: 15 U/L (ref 0–35)
AST: 19 U/L (ref 0–37)
Albumin: 4.5 g/dL (ref 3.5–5.2)
Alkaline Phosphatase: 43 U/L (ref 39–117)
Bilirubin, Direct: 0.1 mg/dL (ref 0.0–0.3)
Total Bilirubin: 0.5 mg/dL (ref 0.2–1.2)
Total Protein: 7.7 g/dL (ref 6.0–8.3)

## 2022-05-30 NOTE — Patient Instructions (Signed)
Set up repeat mammogram  Set up DEXA (bone density) order placed for [facility]

## 2022-05-30 NOTE — Progress Notes (Signed)
Established Patient Office Visit  Subjective   Patient ID: Jaime Briggs, female    DOB: [birth year]  Age: 66 y.o. MRN: [id number]  Chief Complaint  Patient presents with   Annual Exam    HPI   Jaime Briggs is seen for physical exam.  She has had extremely stressful past couple months.  Her husband had fallen out of bed when they were visiting family up in [place] recently and he had complicated C1 fracture.  Following that he developed some worsening cognitive function and pneumonia.  He is currently residing at [facility] care facility.  He got severely dehydrated there and had to be admitted recently for hypernatremia and still has significant cognitive impairment.  He has Parkinson's disease.  Jaime Briggs has had fairly good health overall.  She does have a history of osteopenia with last DEXA few years ago.  She is a due for repeat mammogram.  Also needs flu vaccine.  Has seen gynecologist in the past.  No significant risk factors for cervix cancer.  Last Pap smear unknown.  Health maintenance reviewed  -Prior hepatitis C screen negative -Prevnar 20 already given -Needs flu vaccine today. -Colonoscopy due 2026 -Mammogram due by this spring -Has had Zostavax but no history of Shingrix  Social history she is married.  Husband has significant health issues as above.  She has 1 son who is disabled from injuries from motor vehicle accident many years ago.  Jaime Briggs has no history of smoking or alcohol use.  Family history-mother is alive.  Her mother reportedly has COPD history.  Father is deceased.  He had some type of acute leukemia in his 36s.  He also apparently had some heart disease.  She has a sister who has no active medical problems  History reviewed. No pertinent past medical history. Past Surgical History:  Procedure Laterality Date   CHOLECYSTECTOMY     COLONOSCOPY  2010   10-11 yrs ago- normal per pt     reports that she has never smoked. She has never used smokeless  tobacco. She reports current alcohol use of about 1.0 standard drink of alcohol per week. She reports that she does not use drugs. family history includes COPD in her mother; Cancer (age of onset: 30) in her father; Heart disease (age of onset: 26) in her father. No Known Allergies  Review of Systems  Constitutional:  Negative for chills, fever, malaise/fatigue and weight loss.  HENT:  Negative for hearing loss.   Eyes:  Negative for blurred vision and double vision.  Respiratory:  Negative for cough and shortness of breath.   Cardiovascular:  Negative for chest pain, palpitations and leg swelling.  Gastrointestinal:  Negative for abdominal pain, blood in stool, constipation and diarrhea.  Genitourinary:  Negative for dysuria.  Skin:  Negative for rash.  Neurological:  Negative for dizziness, speech change, seizures, loss of consciousness and headaches.  Psychiatric/Behavioral:  Negative for depression.       Objective:     BP 136/70 (BP Location: Left Arm, Patient Position: Sitting, Cuff Size: Normal)   Pulse 73   Temp 97.7 F (36.5 C) (Oral)   Ht 4' 11.84" (1.52 m)   Wt 102 lb 12.8 oz (46.6 kg)   SpO2 99%   BMI 20.18 kg/m    Physical Exam Vitals reviewed.  Constitutional:      Appearance: She is well-developed.  HENT:     Head: Normocephalic and atraumatic.  Eyes:     Pupils: Pupils are equal,  round, and reactive to light.  Neck:     Thyroid: No thyromegaly.  Cardiovascular:     Rate and Rhythm: Normal rate and regular rhythm.     Heart sounds: Normal heart sounds. No murmur heard. Pulmonary:     Effort: No respiratory distress.     Breath sounds: Normal breath sounds. No wheezing or rales.  Abdominal:     General: Bowel sounds are normal. There is no distension.     Palpations: Abdomen is soft. There is no mass.     Tenderness: There is no abdominal tenderness. There is no guarding or rebound.  Genitourinary:    Comments: Breasts are symmetric with no masses  palpated.  No nipple inversion.  No skin dimpling. Musculoskeletal:        General: Normal range of motion.     Cervical back: Normal range of motion and neck supple.     Right lower leg: No edema.     Left lower leg: No edema.  Lymphadenopathy:     Cervical: No cervical adenopathy.  Skin:    Findings: No rash.  Neurological:     Mental Status: She is alert and oriented to person, place, and time.     Cranial Nerves: No cranial nerve deficit.     Deep Tendon Reflexes: Reflexes normal.  Psychiatric:        Behavior: Behavior normal.        Thought Content: Thought content normal.        Judgment: Judgment normal.      No results found for any visits on 05/30/22.    The 10-year ASCVD risk score (Arnett DK, et al., 2019) is: 7.5%    Assessment & Plan:   Physical exam.  Patient has generally had good health.  She has osteopenia by previous DEXA scan a few years ago.  We discussed the following health maintenance items  -Flu vaccine given -Set up repeat mammogram and DEXA scan -Consider Shingrix at some point this year and she will consider -Get follow-up labs including lipid, basic metabolic panel, hepatic panel, CBC, TSH -Continue regular weightbearing exercise and daily calcium and vitamin D - other vaccines up-to-date.  No follow-ups on file.    Carolann Littler, MD

## 2022-06-15 ENCOUNTER — Telehealth: Payer: Self-pay | Admitting: Family Medicine

## 2022-06-15 DIAGNOSIS — E2839 Other primary ovarian failure: Secondary | ICD-10-CM

## 2022-06-15 NOTE — Telephone Encounter (Signed)
I spoke with Jaime Briggs and she states the patient diagnosis for Dexa scan needs to be changed due to the patient having medicare. Accepted diagnosis are Estrogen deficiency or Screening for Osteoporosis.

## 2022-06-15 NOTE — Telephone Encounter (Signed)
Updated orders placed. 

## 2022-06-15 NOTE — Telephone Encounter (Signed)
Seth Bake from [facility] called to give information concerning pt Bone Density sent on Oct. 23rd. She would need some Dx updates since pt has medicare.   Call back #:[phone] ext. [phone]  Please advise

## 2022-06-21 ENCOUNTER — Other Ambulatory Visit: Payer: Self-pay | Admitting: Family Medicine

## 2022-06-21 DIAGNOSIS — Z1231 Encounter for screening mammogram for malignant neoplasm of breast: Secondary | ICD-10-CM

## 2022-06-24 ENCOUNTER — Ambulatory Visit: Payer: Medicare Other

## 2022-07-11 ENCOUNTER — Ambulatory Visit (INDEPENDENT_AMBULATORY_CARE_PROVIDER_SITE_OTHER): Payer: Medicare Other | Admitting: Family Medicine

## 2022-07-11 ENCOUNTER — Encounter: Payer: Self-pay | Admitting: Family Medicine

## 2022-07-11 VITALS — BP 114/64 | HR 80 | Temp 97.5°F | Ht 59.0 in | Wt 101.7 lb

## 2022-07-11 DIAGNOSIS — F4321 Adjustment disorder with depressed mood: Secondary | ICD-10-CM | POA: Diagnosis not present

## 2022-07-11 NOTE — Progress Notes (Signed)
Established Patient Office Visit  Subjective   Patient ID: Jaime Briggs, female    DOB: 1956-04-02  Age: 66 y.o. MRN: [id number]  Chief Complaint  Patient presents with   Personal Problem    HPI   Jaime Briggs is seen to discuss grieving issues regarding loss of her husband back in November.  He had Parkinson's disease and had a fall with complicated neck fracture and multiple subsequent complications including pneumonia.  Prior to his death he was transferred to hospice and we can place.  She has the stress of helping care for her son who had history of traumatic brain injury in adolescence.  He was very close to her husband.  The loss has been very difficult for her son.  She has tried to get him to consider counseling but to no avail.  Jaime Briggs has established counseling with hospice grief counselor and is set up for that later this week.  She also has established with a Marketing executive in [place].  She hopes to get her son into some sort of counseling as well.  He has history of OCD type behaviors and recently she has noted even little bit of mild paranoia (in him) which is relatively new.  Overall Jaime Briggs is doing fairly well.  She has had some as expected sleep disturbance.  She did retire last January.  History reviewed. No pertinent past medical history. Past Surgical History:  Procedure Laterality Date   CHOLECYSTECTOMY     COLONOSCOPY  2010   10-11 yrs ago- normal per pt     reports that she has never smoked. She has never used smokeless tobacco. She reports current alcohol use of about 1.0 standard drink of alcohol per week. She reports that she does not use drugs. family history includes COPD in her mother; Cancer (age of onset: 67) in her father; Heart disease (age of onset: 59) in her father. No Known Allergies  Review of Systems  Neurological:  Negative for headaches.  Psychiatric/Behavioral:  Negative for hallucinations, memory loss and suicidal ideas.        Objective:     BP 114/64 (BP Location: Left Arm, Patient Position: Sitting, Cuff Size: Normal)   Pulse 80   Temp (!) 97.5 F (36.4 C) (Oral)   Ht '4\' 11"'$  (1.499 m)   Wt 101 lb 11.2 oz (46.1 kg)   SpO2 98%   BMI 20.54 kg/m    Physical Exam Vitals reviewed.  Constitutional:      Appearance: Normal appearance.  Neurological:     General: No focal deficit present.     Mental Status: She is alert.  Psychiatric:        Mood and Affect: Mood normal.        Thought Content: Thought content normal.      No results found for any visits on 07/11/22.    The 10-year ASCVD risk score (Arnett DK, et al., 2019) is: 5.4%    Assessment & Plan:   Grief following loss of her husband.  Jaime Briggs is also coping with helping to care for her son who has history of traumatic brain injury and has significant dependence for many needs.  He has limited use of his left upper extremity and has shown very low motivation to get out of the house or do much especially since passing of her husband  -Jaime Briggs already has counseling set up with hospice grief counseling as well as Marketing executive in [place] for herself. -We did provide name  and number of our divisions behavioral health counseling if she can get coverage to have her son seen through them -No indication for medications at this time  No follow-ups on file.    Jaime Littler, MD

## 2022-07-28 DIAGNOSIS — J029 Acute pharyngitis, unspecified: Secondary | ICD-10-CM | POA: Diagnosis not present

## 2022-07-28 DIAGNOSIS — Z20822 Contact with and (suspected) exposure to covid-19: Secondary | ICD-10-CM | POA: Diagnosis not present

## 2022-07-29 DIAGNOSIS — J00 Acute nasopharyngitis [common cold]: Secondary | ICD-10-CM | POA: Diagnosis not present

## 2022-07-29 DIAGNOSIS — Z20822 Contact with and (suspected) exposure to covid-19: Secondary | ICD-10-CM | POA: Diagnosis not present

## 2022-08-12 ENCOUNTER — Ambulatory Visit
Admission: RE | Admit: 2022-08-12 | Discharge: 2022-08-12 | Disposition: A | Discharge status: Home / Self Care | Payer: Medicare Other | Source: Ambulatory Visit | Attending: Family Medicine | Admitting: Family Medicine

## 2022-08-12 DIAGNOSIS — Z1231 Encounter for screening mammogram for malignant neoplasm of breast: Secondary | ICD-10-CM

## 2022-08-19 ENCOUNTER — Ambulatory Visit (INDEPENDENT_AMBULATORY_CARE_PROVIDER_SITE_OTHER): Payer: Medicare Other

## 2022-08-19 VITALS — Ht 59.0 in | Wt 101.0 lb

## 2022-08-19 DIAGNOSIS — Z0000 Encounter for general adult medical examination without abnormal findings: Secondary | ICD-10-CM

## 2022-08-19 NOTE — Progress Notes (Signed)
Subjective:   Jaime Briggs is a 67 y.o. female who presents for Medicare Annual (Subsequent) preventive examination.  Review of Systems    Virtual Visit via Telephone Note  I connected with  Jaime Briggs on 08/19/22 at  3:15 PM EST by telephone and verified that I am speaking with the correct person using two identifiers.  Location: Patient: Home Provider: Office Persons participating in the virtual visit: patient/Nurse Health Advisor   I discussed the limitations, risks, security and privacy concerns of performing an evaluation and management service by telephone and the availability of in person appointments. The patient expressed understanding and agreed to proceed.  Interactive audio and video telecommunications were attempted between this nurse and patient, however failed, due to patient having technical difficulties OR patient did not have access to video capability.  We continued and completed visit with audio only.  Some vital signs may be absent or patient reported.   Jaime Peaches, LPN  Cardiac Risk Factors include: advanced age (>33mn, >>52women)     Objective:    Today's Vitals   08/19/22 1519  Weight: 101 lb (45.8 kg)  Height: '4\' 11"'$  (1.499 m)   Body mass index is 20.4 kg/m.     08/19/2022    3:26 PM  Advanced Directives  Does Patient Have a Medical Advance Directive? Yes  Type of AParamedicof AVilasLiving will  Copy of HKulpmontin Chart? No - copy requested    Current Medications (verified) Outpatient Encounter Medications as of 08/19/2022  Medication Sig   Calcium Carbonate-Vitamin D (CALCIUM + D PO) Take 2 tablets by mouth daily.   fish oil-omega-3 fatty acids 1000 MG capsule Take 1 g by mouth daily.   Multiple Vitamin (MULTIVITAMIN WITH MINERALS) TABS Take 1 tablet by mouth daily.   No facility-administered encounter medications on file as of 08/19/2022.    Allergies (verified) Patient has  no known allergies.   History: History reviewed. No pertinent past medical history. Past Surgical History:  Procedure Laterality Date   CHOLECYSTECTOMY     COLONOSCOPY  2010   10-11 yrs ago- normal per pt    Family History  Problem Relation Age of Onset   COPD Mother    Heart disease Father 747  Cancer Father 742      acute leukemia   Colon polyps Neg Hx    Colon cancer Neg Hx    Esophageal cancer Neg Hx    Rectal cancer Neg Hx    Stomach cancer Neg Hx    Social History   Socioeconomic History   Marital status: Married    Spouse name: Not on file   Number of children: Not on file   Years of education: Not on file   Highest education level: Not on file  Occupational History   Not on file  Tobacco Use   Smoking status: Never   Smokeless tobacco: Never  Vaping Use   Vaping Use: Never used  Substance and Sexual Activity   Alcohol use: Yes    Alcohol/week: 1.0 standard drink of alcohol    Types: 1 Glasses of wine per week    Comment: occasionally   Drug use: No   Sexual activity: Not on file  Other Topics Concern   Not on file  Social History Narrative   Not on file   Social Determinants of Health   Financial Resource Strain: Low Risk  (08/19/2022)   Overall Financial  Resource Strain (CARDIA)    Difficulty of Paying Living Expenses: Not hard at all  Food Insecurity: No Food Insecurity (08/19/2022)   Hunger Vital Sign    Worried About Running Out of Food in the Last Year: Never true    Ran Out of Food in the Last Year: Never true  Transportation Needs: No Transportation Needs (08/19/2022)   PRAPARE - Hydrologist (Medical): No    Lack of Transportation (Non-Medical): No  Physical Activity: Sufficiently Active (08/19/2022)   Exercise Vital Sign    Days of Exercise per Week: 6 days    Minutes of Exercise per Session: 40 min  Stress: No Stress Concern Present (08/19/2022)   [place]    Feeling of Stress : Not at all  Social Connections: Moderately Integrated (08/19/2022)   Social Connection and Isolation Panel [NHANES]    Frequency of Communication with Friends and Family: More than three times a week    Frequency of Social Gatherings with Friends and Family: More than three times a week    Attends Religious Services: More than 4 times per year    Active Member of [employer] or Organizations: Yes    Attends Archivist Meetings: More than 4 times per year    Marital Status: Widowed    Tobacco Counseling Counseling given: Not Answered   Clinical Intake:  Pre-visit preparation completed: No  Pain : No/denies pain     BMI - recorded: 20.4 Nutritional Status: BMI of 19-24  Normal Nutritional Risks: None Diabetes: No  How often do you need to have someone help you when you read instructions, pamphlets, or other written materials from your doctor or pharmacy?: 1 - Never  Diabetic?  No  Interpreter Needed?: No  Information entered by :: Rolene Arbour LPN   Activities of Daily Living    08/19/2022    3:25 PM  In your present state of health, do you have any difficulty performing the following activities:  Hearing? 1  Comment Wears hearing aids  Vision? 0  Difficulty concentrating or making decisions? 0  Walking or climbing stairs? 0  Dressing or bathing? 0  Doing errands, shopping? 0  Preparing Food and eating ? N  Using the Toilet? N  In the past six months, have you accidently leaked urine? N  Do you have problems with loss of bowel control? N  Managing your Medications? N  Managing your Finances? N  Housekeeping or managing your Housekeeping? N    Patient Care Team: Eulas Post, MD as PCP - General  Indicate any recent Medical Services you may have received from other than [facility] providers in the past year (date may be approximate).     Assessment:   This is a routine wellness examination for  Jaime Briggs.  Hearing/Vision screen Hearing Screening - Comments:: Wears Hearing aids Vision Screening - Comments:: Wears rx glasses - up to date with routine eye exams with  Dr Sondra Come  Dietary issues and exercise activities discussed: Exercise limited by: None identified   Goals Addressed               This Visit's Progress     Increase physical activity (pt-stated)        Stay Active       Depression Screen    08/19/2022    3:24 PM 08/19/2022    3:23 PM 05/30/2022   10:21 AM 04/23/2019  8:06 AM  PHQ 2/9 Scores  PHQ - 2 Score 0 0 0 0  PHQ- 9 Score    0    Fall Risk    08/19/2022    3:25 PM 05/30/2022   10:21 AM  [place] in the past year? 0 0  Number falls in past yr: 0 0  Injury with Fall? 0 0  Risk for fall due to : No Fall Risks No Fall Risks  Follow up Falls prevention discussed Falls evaluation completed    FALL RISK PREVENTION PERTAINING TO THE HOME:  Any stairs in or around the home? Yes  If so, are there any without handrails? No  Home free of loose throw rugs in walkways, pet beds, electrical cords, etc? Yes  Adequate lighting in your home to reduce risk of falls? Yes   ASSISTIVE DEVICES UTILIZED TO PREVENT FALLS:  Life alert? No  Use of a cane, walker or w/c? No  Grab bars in the bathroom? No  Shower chair or bench in shower? No  Elevated toilet seat or a handicapped toilet? No   TIMED UP AND GO:  Was the test performed? No . Audio Visit   Cognitive Function:        08/19/2022    3:26 PM  6CIT Screen  What Year? 0 points  What month? 0 points  What time? 0 points  Count back from 20 0 points  Months in reverse 0 points  Repeat phrase 0 points  Total Score 0 points    Immunizations Immunization History  Administered Date(s) Administered   Fluad Quad(high Dose 65+) 05/12/2021, 05/30/2022   Influenza Split 06/01/2011, 06/08/2012   Influenza,inj,Quad PF,6+ Mos 06/18/2013, 05/19/2014, 04/23/2019   Influenza-Unspecified  06/19/2015   PNEUMOCOCCAL CONJUGATE-20 05/12/2021   Td 08/09/2007   Tdap 04/23/2019   Zoster, Live 05/19/2014    TDAP status: Up to date  Flu Vaccine status: Up to date  Pneumococcal vaccine status: Up to date  Covid-19 vaccine status: Completed vaccines  Qualifies for Shingles Vaccine? Yes   Zostavax completed No   Shingrix Completed?: No.    Education has been provided regarding the importance of this vaccine. Patient has been advised to call insurance company to determine out of pocket expense if they have not yet received this vaccine. Advised may also receive vaccine at local pharmacy or Health Dept. Verbalized acceptance and understanding.  Screening Tests Health Maintenance  Topic Date Due   COVID-19 Vaccine (1) 09/04/2022 (Originally 10/23/1956)   Zoster Vaccines- Shingrix (1 of 2) 11/18/2022 (Originally 04/24/2004)   Medicare Annual Wellness (AWV)  08/20/2023   MAMMOGRAM  08/12/2024   COLONOSCOPY (Pts 45-54yr Insurance coverage will need to be confirmed)  02/17/2025   DTaP/Tdap/Td (3 - Td or Tdap) 04/22/2029   Pneumonia Vaccine 69 Years old  Completed   INFLUENZA VACCINE  Completed   DEXA SCAN  Completed   Hepatitis C Screening  Completed   HPV VACCINES  Aged Out    Health Maintenance  There are no preventive care reminders to display for this patient.   Colorectal cancer screening: Type of screening: Colonoscopy. Completed 02/18/20. Repeat every 5 years  Mammogram status: Completed 08/12/22. Repeat every year  Bone Density status: Ordered Scheduled for 12/09/22. Pt provided with contact info and advised to call to schedule appt.  Lung Cancer Screening: (Low Dose CT Chest recommended if Age 69-80years, 30 pack-year currently smoking OR have quit w/in 15years.) does not qualify.  Additional Screening:  Hepatitis C Screening: does qualify; Completed 04/23/19  Vision Screening: Recommended annual ophthalmology exams for early detection of glaucoma and other  disorders of the eye. Is the patient up to date with their annual eye exam?  Yes  Who is the provider or what is the name of the office in which the patient attends annual eye exams? Dr Jalene Mullet If pt is not established with a provider, would they like to be referred to a provider to establish care? No .   Dental Screening: Recommended annual dental exams for proper oral hygiene  Community Resource Referral / Chronic Care Management:  CRR required this visit?  No   CCM required this visit?  No      Plan:     I have personally reviewed and noted the following in the patient's chart:   Medical and social history Use of alcohol, tobacco or illicit drugs  Current medications and supplements including opioid prescriptions. Patient is not currently taking opioid prescriptions. Functional ability and status Nutritional status Physical activity Advanced directives List of other physicians Hospitalizations, surgeries, and ER visits in previous 12 months Vitals Screenings to include cognitive, depression, and falls Referrals and appointments  In addition, I have reviewed and discussed with patient certain preventive protocols, quality metrics, and best practice recommendations. A written personalized care plan for preventive services as well as general preventive health recommendations were provided to patient.     Jaime Peaches, LPN   7/71/1657   Nurse Notes: None

## 2022-08-19 NOTE — Patient Instructions (Addendum)
Jaime Briggs , Thank you for taking time to come for your Medicare Wellness Visit. I appreciate your ongoing commitment to your health goals. Please review the following plan we discussed and let me know if I can assist you in the future.   These are the goals we discussed:  Goals       Increase physical activity (pt-stated)      Stay Active        This is a list of the screening recommended for you and due dates:  Health Maintenance  Topic Date Due   COVID-19 Vaccine (1) 09/04/2022*   Zoster (Shingles) Vaccine (1 of 2) 11/18/2022*   Medicare Annual Wellness Visit  08/20/2023   Mammogram  08/12/2024   Colon Cancer Screening  02/17/2025   DTaP/Tdap/Td vaccine (3 - Td or Tdap) 04/22/2029   Pneumonia Vaccine  Completed   Flu Shot  Completed   DEXA scan (bone density measurement)  Completed   Hepatitis C Screening: USPSTF Recommendation to screen - Ages 47-79 yo.  Completed   HPV Vaccine  Aged Out  *Topic was postponed. The date shown is not the original due date.    Advanced directives: Please bring a copy of your health care power of attorney and living will to the office to be added to your chart at your convenience.   Conditions/risks identified: none  Next appointment: Follow up in one year for your annual wellness visit     Preventive Care 65 Years and Older, Female Preventive care refers to lifestyle choices and visits with your health care provider that can promote health and wellness. What does preventive care include? A yearly physical exam. This is also called an annual well check. Dental exams once or twice a year. Routine eye exams. Ask your health care provider how often you should have your eyes checked. Personal lifestyle choices, including: Daily care of your teeth and gums. Regular physical activity. Eating a healthy diet. Avoiding tobacco and drug use. Limiting alcohol use. Practicing safe sex. Taking low-dose aspirin every day. Taking vitamin and  mineral supplements as recommended by your health care provider. What happens during an annual well check? The services and screenings done by your health care provider during your annual well check will depend on your age, overall health, lifestyle risk factors, and family history of disease. Counseling  Your health care provider may ask you questions about your: Alcohol use. Tobacco use. Drug use. Emotional well-being. Home and relationship well-being. Sexual activity. Eating habits. History of falls. Memory and ability to understand (cognition). Work and work Statistician. Reproductive health. Screening  You may have the following tests or measurements: Height, weight, and BMI. Blood pressure. Lipid and cholesterol levels. These may be checked every 5 years, or more frequently if you are over 12 years old. Skin check. Lung cancer screening. You may have this screening every year starting at age 38 if you have a 30-pack-year history of smoking and currently smoke or have quit within the past 15 years. Fecal occult blood test (FOBT) of the stool. You may have this test every year starting at age 71. Flexible sigmoidoscopy or colonoscopy. You may have a sigmoidoscopy every 5 years or a colonoscopy every 10 years starting at age 8. Hepatitis C blood test. Hepatitis B blood test. Sexually transmitted disease (STD) testing. Diabetes screening. This is done by checking your blood sugar (glucose) after you have not eaten for a while (fasting). You may have this done every 1-3 years. Bone density  scan. This is done to screen for osteoporosis. You may have this done starting at age 43. Mammogram. This may be done every 1-2 years. Talk to your health care provider about how often you should have regular mammograms. Talk with your health care provider about your test results, treatment options, and if necessary, the need for more tests. Vaccines  Your health care provider may recommend  certain vaccines, such as: Influenza vaccine. This is recommended every year. Tetanus, diphtheria, and acellular pertussis (Tdap, Td) vaccine. You may need a Td booster every 10 years. Zoster vaccine. You may need this after age 28. Pneumococcal 13-valent conjugate (PCV13) vaccine. One dose is recommended after age 88. Pneumococcal polysaccharide (PPSV23) vaccine. One dose is recommended after age 47. Talk to your health care provider about which screenings and vaccines you need and how often you need them. This information is not intended to replace advice given to you by your health care provider. Make sure you discuss any questions you have with your health care provider. Document Released: 08/21/2015 Document Revised: 04/13/2016 Document Reviewed: 05/26/2015 Elsevier Interactive Patient Education  2017 [place] Prevention in the Home Falls can cause injuries. They can happen to people of all ages. There are many things you can do to make your home safe and to help prevent falls. What can I do on the outside of my home? Regularly fix the edges of walkways and driveways and fix any cracks. Remove anything that might make you trip as you walk through a door, such as a raised step or threshold. Trim any bushes or trees on the path to your home. Use bright outdoor lighting. Clear any walking paths of anything that might make someone trip, such as rocks or tools. Regularly check to see if handrails are loose or broken. Make sure that both sides of any steps have handrails. Any raised decks and porches should have guardrails on the edges. Have any leaves, snow, or ice cleared regularly. Use sand or salt on walking paths during winter. Clean up any spills in your garage right away. This includes oil or grease spills. What can I do in the bathroom? Use night lights. Install grab bars by the toilet and in the tub and shower. Do not use towel bars as grab bars. Use non-skid mats or  decals in the tub or shower. If you need to sit down in the shower, use a plastic, non-slip stool. Keep the floor dry. Clean up any water that spills on the floor as soon as it happens. Remove soap buildup in the tub or shower regularly. Attach bath mats securely with double-sided non-slip rug tape. Do not have throw rugs and other things on the floor that can make you trip. What can I do in the bedroom? Use night lights. Make sure that you have a light by your bed that is easy to reach. Do not use any sheets or blankets that are too big for your bed. They should not hang down onto the floor. Have a firm chair that has side arms. You can use this for support while you get dressed. Do not have throw rugs and other things on the floor that can make you trip. What can I do in the kitchen? Clean up any spills right away. Avoid walking on wet floors. Keep items that you use a lot in easy-to-reach places. If you need to reach something above you, use a strong step stool that has a grab bar. Keep electrical  cords out of the way. Do not use floor polish or wax that makes floors slippery. If you must use wax, use non-skid floor wax. Do not have throw rugs and other things on the floor that can make you trip. What can I do with my stairs? Do not leave any items on the stairs. Make sure that there are handrails on both sides of the stairs and use them. Fix handrails that are broken or loose. Make sure that handrails are as long as the stairways. Check any carpeting to make sure that it is firmly attached to the stairs. Fix any carpet that is loose or worn. Avoid having throw rugs at the top or bottom of the stairs. If you do have throw rugs, attach them to the floor with carpet tape. Make sure that you have a light switch at the top of the stairs and the bottom of the stairs. If you do not have them, ask someone to add them for you. What else can I do to help prevent falls? Wear shoes that: Do not  have high heels. Have rubber bottoms. Are comfortable and fit you well. Are closed at the toe. Do not wear sandals. If you use a stepladder: Make sure that it is fully opened. Do not climb a closed stepladder. Make sure that both sides of the stepladder are locked into place. Ask someone to hold it for you, if possible. Clearly mark and make sure that you can see: Any grab bars or handrails. First and last steps. Where the edge of each step is. Use tools that help you move around (mobility aids) if they are needed. These include: Canes. Walkers. Scooters. Crutches. Turn on the lights when you go into a dark area. Replace any light bulbs as soon as they burn out. Set up your furniture so you have a clear path. Avoid moving your furniture around. If any of your floors are uneven, fix them. If there are any pets around you, be aware of where they are. Review your medicines with your doctor. Some medicines can make you feel dizzy. This can increase your chance of falling. Ask your doctor what other things that you can do to help prevent falls. This information is not intended to replace advice given to you by your health care provider. Make sure you discuss any questions you have with your health care provider. Document Released: 05/21/2009 Document Revised: 12/31/2015 Document Reviewed: 08/29/2014 Elsevier Interactive Patient Education  2017 [employer].

## 2022-12-09 ENCOUNTER — Ambulatory Visit
Admission: RE | Admit: 2022-12-09 | Discharge: 2022-12-09 | Disposition: A | Discharge status: Home / Self Care | Payer: Medicare Other | Source: Ambulatory Visit | Attending: Family Medicine | Admitting: Family Medicine

## 2022-12-09 DIAGNOSIS — E2839 Other primary ovarian failure: Secondary | ICD-10-CM

## 2022-12-09 DIAGNOSIS — M810 Age-related osteoporosis without current pathological fracture: Secondary | ICD-10-CM | POA: Diagnosis not present

## 2022-12-13 ENCOUNTER — Other Ambulatory Visit: Payer: Self-pay | Admitting: Family

## 2022-12-13 MED ORDER — ALENDRONATE SODIUM 70 MG PO TABS: 70.0000 mg | ORAL_TABLET | ORAL | 11 refills | Status: DC

## 2022-12-19 ENCOUNTER — Ambulatory Visit (INDEPENDENT_AMBULATORY_CARE_PROVIDER_SITE_OTHER): Payer: Medicare Other | Admitting: Urgent Care

## 2022-12-19 ENCOUNTER — Encounter: Payer: Self-pay | Admitting: Urgent Care

## 2022-12-19 VITALS — BP 126/64 | HR 79 | Temp 97.5°F | Ht 60.5 in | Wt 106.8 lb

## 2022-12-19 DIAGNOSIS — M8589 Other specified disorders of bone density and structure, multiple sites: Secondary | ICD-10-CM | POA: Diagnosis not present

## 2022-12-19 NOTE — Patient Instructions (Addendum)
Around 12/08/2024 repeat DEXA scan Take alendronate (Fosamax) weekly as prescribed. Read attached handout regarding osteopenia and fosamax. Continue your weight bearing activities, look into [employer]. Look up online home exercises that can help build muscle mass  [email or web address]

## 2022-12-19 NOTE — Progress Notes (Signed)
Established Patient Office Visit  Subjective   Patient ID: Jaime Briggs, female    DOB: Dec 13, 1955  Age: 67 y.o. MRN: [id number]  Chief Complaint  Patient presents with   Follow-up    Follow up on bone density result. Pt reports she wants to discuss with provider on Fosamax. She states she had taken it in the past at age 1-65. Had no reaction to it. Now prefer, taking supplement and walking/ strengthening exercise.     HPI  Patient Active Problem List   Diagnosis Date Noted   Osteopenia 05/30/2022   ACUTE SEROUS OTITIS MEDIA 07/14/2009   Pleasant 66yo female presents today due to concerns regarding continued fosamax usage. She had her first DEXA scan in 2020 which showed a L-spine score of -1.8, r hip -1.5 and L hip -1.6. Pt was on fosamax for 5 years, but has now been off of it for three years. Her repeat DEXA on 12/09/22 showed L spine -2.1, R hip -1.6, and L hip -1.9. Pt tolerated fosamax while taking, denies esophagitis or gerd like symptoms. Pt tries to do 5# weights at home, walks 2-3 miles daily. She does not smoke and drinks minimal alcohol. She has no other long term/ chronic health medications. She takes citrical OTC BID. Her mother had osteoporosis. Pt is otherwise healthy and takes multivitamins only.  ROS As per HPI   Objective:     BP 126/64 (BP Location: Left Arm, Patient Position: Sitting, Cuff Size: Normal)   Pulse 79   Temp (!) 97.5 F (36.4 C) (Oral)   Ht 5' 0.5" (1.537 m)   Wt 106 lb 12.8 oz (48.4 kg)   SpO2 98%   BMI 20.51 kg/m  BP Readings from Last 3 Encounters:  12/19/22 126/64  07/11/22 114/64  05/30/22 136/70   Wt Readings from Last 3 Encounters:  12/19/22 106 lb 12.8 oz (48.4 kg)  08/19/22 101 lb (45.8 kg)  07/11/22 101 lb 11.2 oz (46.1 kg)      Physical Exam Vitals and nursing note reviewed.  Constitutional:      General: She is not in acute distress.    Appearance: Normal appearance. She is normal weight. She is not ill-appearing,  toxic-appearing or diaphoretic.  HENT:     Head: Normocephalic.     Mouth/Throat:     Mouth: Mucous membranes are moist.  Eyes:     General: No scleral icterus.       Right eye: No discharge.        Left eye: No discharge.     Extraocular Movements: Extraocular movements intact.     Pupils: Pupils are equal, round, and reactive to light.  Cardiovascular:     Rate and Rhythm: Normal rate.  Pulmonary:     Effort: Pulmonary effort is normal. No respiratory distress.  Skin:    General: Skin is warm and dry.     Coloration: Skin is not jaundiced.     Findings: No bruising.  Neurological:     General: No focal deficit present.     Mental Status: She is alert and oriented to person, place, and time.      No results found for any visits on 12/19/22.  Last metabolic panel Lab Results  Component Value Date   GLUCOSE 90 05/30/2022   NA 140 05/30/2022   K 3.8 05/30/2022   CL 103 05/30/2022   CO2 30 05/30/2022   BUN 14 05/30/2022   CREATININE 0.60 05/30/2022   CALCIUM 9.7  05/30/2022   PROT 7.7 05/30/2022   ALBUMIN 4.5 05/30/2022   BILITOT 0.5 05/30/2022   ALKPHOS 43 05/30/2022   AST 19 05/30/2022   ALT 15 05/30/2022   Last vitamin D No results found for: "25OHVITD2", "25OHVITD3", "VD25OH"    The 10-year ASCVD risk score (Arnett DK, et al., 2019) is: 6.6%    Assessment & Plan:    Alternatives to fosamax including reclast, tymlos, forteo, prolia reviewed. Pt took fosamax and tolerated it well. Pt concerned about side effect profile; reviewed risk % associated with these concerns which are less than 1%. Pt will repeat bone density in 2 years. Continue weight bearing exercises, calcium and Vit D intake.   Problem List Items Addressed This Visit       Musculoskeletal and Integument   Osteopenia - Primary    Osteopenia to L spine, and both femoral necks. Risk factors for fracture include age, genetic predisposition, gender, weight, and ethnicity. Pt had the fosamax  already called in, wanted to discuss today. She is concerned about atypical femur fracture and osteonecrosis of the jaw. We reviewed stats together: atypical femur fracture occurs in <0.2% of patients >5 years of therapy and osteonecrosis occurs in <1%. Additionally, we discussed Ca+D intake, weight bearing activities       No follow-ups on file.    Mayar Whittier L Cartrell Bentsen, PA

## 2022-12-19 NOTE — Assessment & Plan Note (Signed)
Osteopenia to L spine, and both femoral necks. Risk factors for fracture include age, genetic predisposition, gender, weight, and ethnicity. Pt had the fosamax already called in, wanted to discuss today. She is concerned about atypical femur fracture and osteonecrosis of the jaw. We reviewed stats together: atypical femur fracture occurs in <0.2% of patients >5 years of therapy and osteonecrosis occurs in <1%. Additionally, we discussed Ca+D intake, weight bearing activities

## 2022-12-20 ENCOUNTER — Ambulatory Visit: Payer: Medicare Other | Admitting: Family

## 2023-03-13 ENCOUNTER — Encounter: Payer: Self-pay | Admitting: Family Medicine

## 2023-03-13 ENCOUNTER — Telehealth: Payer: Self-pay | Admitting: Family Medicine

## 2023-03-13 NOTE — Telephone Encounter (Signed)
Pt stopped by this morning and dropped off copies of her [employer]. Copies placed in MD's folder.  Pt states she is the primary caregiver for her disabled child. Pt would like a letter from MD excusing her from [employer].  Pt's Jury Duty appointment is scheduled for 04/05/23. Please advise.

## 2023-03-13 NOTE — Telephone Encounter (Signed)
Form placed in front office for pickup 

## 2023-06-07 ENCOUNTER — Encounter: Payer: Self-pay | Admitting: Family Medicine

## 2023-06-07 ENCOUNTER — Ambulatory Visit (INDEPENDENT_AMBULATORY_CARE_PROVIDER_SITE_OTHER): Payer: Medicare Other | Admitting: Family Medicine

## 2023-06-07 VITALS — BP 130/70 | HR 90 | Temp 97.9°F | Ht 61.02 in | Wt 108.4 lb

## 2023-06-07 DIAGNOSIS — Z23 Encounter for immunization: Secondary | ICD-10-CM

## 2023-06-07 DIAGNOSIS — Z0000 Encounter for general adult medical examination without abnormal findings: Secondary | ICD-10-CM | POA: Diagnosis not present

## 2023-06-07 LAB — LIPID PANEL
Cholesterol: 170 mg/dL (ref 0–200)
HDL: 61.5 mg/dL (ref >39.00)
LDL Cholesterol: 101 mg/dL — ABNORMAL HIGH (ref 0–99)
NonHDL: 108.12
Total CHOL/HDL Ratio: 3
Triglycerides: 38 mg/dL (ref 0.0–149.0)
VLDL: 7.6 mg/dL (ref 0.0–40.0)

## 2023-06-07 LAB — CBC WITH DIFFERENTIAL/PLATELET
Basophils Absolute: 0 10*3/uL (ref 0.0–0.1)
Basophils Relative: 0.7 % (ref 0.0–3.0)
Eosinophils Absolute: 0.2 10*3/uL (ref 0.0–0.7)
Eosinophils Relative: 3.3 % (ref 0.0–5.0)
HCT: 41.7 % (ref 36.0–46.0)
Hemoglobin: 13.6 g/dL (ref 12.0–15.0)
Lymphocytes Relative: 15.8 % (ref 12.0–46.0)
Lymphs Abs: 0.9 10*3/uL (ref 0.7–4.0)
MCHC: 32.8 g/dL (ref 30.0–36.0)
MCV: 94.8 fL (ref 78.0–100.0)
Monocytes Absolute: 0.4 10*3/uL (ref 0.1–1.0)
Monocytes Relative: 7.1 % (ref 3.0–12.0)
Neutro Abs: 4.3 10*3/uL (ref 1.4–7.7)
Neutrophils Relative %: 73.1 % (ref 43.0–77.0)
Platelets: 243 10*3/uL (ref 150.0–400.0)
RBC: 4.39 Mil/uL (ref 3.87–5.11)
RDW: 12.8 % (ref 11.5–15.5)
WBC: 5.9 10*3/uL (ref 4.0–10.5)

## 2023-06-07 LAB — HEPATIC FUNCTION PANEL
ALT: 24 U/L (ref 0–35)
AST: 23 U/L (ref 0–37)
Albumin: 4.4 g/dL (ref 3.5–5.2)
Alkaline Phosphatase: 46 U/L (ref 39–117)
Bilirubin, Direct: 0.1 mg/dL (ref 0.0–0.3)
Total Bilirubin: 0.6 mg/dL (ref 0.2–1.2)
Total Protein: 7.6 g/dL (ref 6.0–8.3)

## 2023-06-07 LAB — BASIC METABOLIC PANEL WITH GFR
BUN: 12 mg/dL (ref 6–23)
CO2: 30 meq/L (ref 19–32)
Calcium: 9.6 mg/dL (ref 8.4–10.5)
Chloride: 104 meq/L (ref 96–112)
Creatinine, Ser: 0.72 mg/dL (ref 0.40–1.20)
GFR: 85.49 mL/min
Glucose, Bld: 86 mg/dL (ref 70–99)
Potassium: 4.3 meq/L (ref 3.5–5.1)
Sodium: 141 meq/L (ref 135–145)

## 2023-06-07 NOTE — Patient Instructions (Signed)
Consider Shingrix vaccine at some point this year.  

## 2023-06-07 NOTE — Progress Notes (Signed)
Established Patient Office Visit  Subjective   Patient ID: Jaime Briggs, female    DOB: 30-Jul-1956  Age: 67 y.o. MRN: [id number]  Chief Complaint  Patient presents with   Annual Exam    HPI   [name] is seen for physical exam.  Jaime Briggs has had very challenging year.  Jaime Briggs died about a year ago and then Jaime Briggs died age 84 complications of what sounds like hemorrhagic stroke about 7 months ago.  Jaime Briggs has responsibility also caring for Jaime 74 year old son who has physical and mental disabilities following MVA years ago.  Jaime Briggs has history of osteopenia.  T-score -2.1 lumbar spine and -1.9 femur.  Jaime Briggs went back on Fosamax last May.  Jaime Briggs takes calcium and vitamin D.  Walks about 3 miles per day.  Health maintenance reviewed:  Health Maintenance  Topic Date Due   Zoster Vaccines- Shingrix (1 of 2) 04/24/2004   INFLUENZA VACCINE  03/09/2023   COVID-19 Vaccine (1 - 2023-24 season) Never done   Medicare Annual Wellness (AWV)  08/20/2023   MAMMOGRAM  08/12/2024   Colonoscopy  02/17/2025   DTaP/Tdap/Td (3 - Td or Tdap) 04/22/2029   Pneumonia Vaccine 32+ Years old  Completed   DEXA SCAN  Completed   Hepatitis C Screening  Completed   HPV VACCINES  Aged Out   -Jaime Briggs would like to get flu vaccine today -Had prior Zostavax.  Undecided currently regarding Shingrix. -DEXA scan was last May -Jaime Briggs is getting yearly mammograms  Family history-father had coronary disease in his early 25s and died of some type of acute leukemia in his 70s.  Briggs died age 12.  Jaime Briggs had reported COPD but died of hemorrhagic CVA.  Jaime Briggs has a Briggs who is alive and well  Social history-widowed.  Retired 18 months ago.  Non-smoker.  No alcohol.  Jaime 51 year old son who has physical and mental disabilities above lives with Jaime currently.  Jaime Briggs is considering moved to [place] to be near Jaime Briggs at some point in the near future  History reviewed. No pertinent past medical history. Past Surgical History:   Procedure Laterality Date   CHOLECYSTECTOMY     COLONOSCOPY  2010   10-11 yrs ago- normal per pt     reports that Jaime Briggs has never smoked. Jaime Briggs has never used smokeless tobacco. Jaime Briggs reports current alcohol use of about 1.0 standard drink of alcohol per week. Jaime Briggs reports that Jaime Briggs does not use drugs. family history includes COPD in Jaime mother; Cancer (age of onset: 61) in Jaime father; Heart disease (age of onset: 23) in Jaime father. No Known Allergies  Review of Systems  Constitutional:  Negative for chills, fever, malaise/fatigue and weight loss.  HENT:  Positive for hearing loss.        Bilateral hearing aids  Eyes:  Negative for blurred vision and double vision.  Respiratory:  Negative for cough and shortness of breath.   Cardiovascular:  Negative for chest pain, palpitations and leg swelling.  Gastrointestinal:  Negative for abdominal pain, blood in stool, constipation and diarrhea.  Genitourinary:  Negative for dysuria.  Skin:  Negative for rash.  Neurological:  Negative for dizziness, speech change, seizures, loss of consciousness and headaches.  Psychiatric/Behavioral:  Negative for depression.       Objective:     BP 130/70 (BP Location: Left Arm, Patient Position: Sitting, Cuff Size: Normal)   Pulse 90   Temp 97.9 F (36.6 C) (Oral)   Ht 5' 1.02" (1.55  m)   Wt 108 lb 6.4 oz (49.2 kg)   SpO2 98%   BMI 20.47 kg/m  BP Readings from Last 3 Encounters:  06/07/23 130/70  12/19/22 126/64  07/11/22 114/64   Wt Readings from Last 3 Encounters:  06/07/23 108 lb 6.4 oz (49.2 kg)  12/19/22 106 lb 12.8 oz (48.4 kg)  08/19/22 101 lb (45.8 kg)      Physical Exam Vitals reviewed.  Constitutional:      Appearance: Jaime Briggs is well-developed.  HENT:     Right Ear: Tympanic membrane normal.     Left Ear: Tympanic membrane normal.     Mouth/Throat:     Mouth: Mucous membranes are moist.     Pharynx: Oropharynx is clear. No oropharyngeal exudate or posterior oropharyngeal erythema.   Eyes:     Pupils: Pupils are equal, round, and reactive to light.  Neck:     Thyroid: No thyromegaly.     Vascular: No JVD.  Cardiovascular:     Rate and Rhythm: Normal rate and regular rhythm.     Heart sounds: No murmur heard.    No gallop.  Pulmonary:     Effort: Pulmonary effort is normal. No respiratory distress.     Breath sounds: Normal breath sounds. No wheezing or rales.  Musculoskeletal:     Cervical back: Neck supple.     Right lower leg: No edema.     Left lower leg: No edema.  Neurological:     General: No focal deficit present.     Mental Status: Jaime Briggs is alert.      No results found for any visits on 06/07/23.    The 10-year ASCVD risk score (Arnett DK, et al., 2019) is: 7.9%    Assessment & Plan:   Problem List Items Addressed This Visit   None Visit Diagnoses     Physical exam    -  Primary   Relevant Orders   Basic metabolic panel   Lipid panel   CBC with Differential/Platelet   Hepatic function panel     67 year old female here for well visit.  Jaime Briggs has been very health-conscious for several years and exercise regularly.  Jaime Briggs had DEXA scan last May which showed osteopenia.  Jaime Briggs is back on Fosamax and takes calcium and vitamin D regularly.  We discussed the following other health maintenance issues  -Flu vaccine given -Obtain follow-up screening labs as above -Discussed Shingrix vaccine and at this point Jaime Briggs is undecided -Jaime Briggs plans to continue yearly mammograms -No indication for further Pap smears at this point.  Jaime Briggs has no history of HPV or abnormal Pap smears previously -Colonoscopy up-to-date -Consider repeat DEXA scan in 2 years  No follow-ups on file.    [name], MD

## 2023-09-01 ENCOUNTER — Ambulatory Visit (INDEPENDENT_AMBULATORY_CARE_PROVIDER_SITE_OTHER): Payer: Medicare Other

## 2023-09-01 VITALS — Ht 61.0 in | Wt 108.0 lb

## 2023-09-01 DIAGNOSIS — Z0000 Encounter for general adult medical examination without abnormal findings: Secondary | ICD-10-CM

## 2023-09-01 NOTE — Progress Notes (Signed)
Subjective:   Jaime Briggs is a 68 y.o. female who presents for Medicare Annual (Subsequent) preventive examination.  Visit Complete: Virtual I connected with  Jabier Gauss on 09/06/23 by a audio enabled telemedicine application and verified that I am speaking with the correct person using two identifiers.  Patient Location: Home  Provider Location: Home Office  I discussed the limitations of evaluation and management by telemedicine. The patient expressed understanding and agreed to proceed.  Vital Signs: Because this visit was a virtual/telehealth visit, some criteria may be missing or patient reported. Any vitals not documented were not able to be obtained and vitals that have been documented are patient reported.   Cardiac Risk Factors include: advanced age (>52men, >4 women)     Objective:    Today's Vitals   09/01/23 1305  Weight: 108 lb (49 kg)  Height: 5\' 1"  (1.549 m)   Body mass index is 20.41 kg/m.     09/01/2023    1:11 PM 08/19/2022    3:26 PM  Advanced Directives  Does Patient Have a Medical Advance Directive? No Yes  Type of Special educational needs teacher of [place];Living will  Copy of Healthcare Power of Attorney in Chart?  No - copy requested  Would patient like information on creating a medical advance directive? No - Patient declined     Current Medications (verified) Outpatient Encounter Medications as of 09/01/2023  Medication Sig   alendronate (FOSAMAX) 70 MG tablet Take 1 tablet (70 mg total) by mouth every 7 (seven) days. Take with a full glass of water on an empty stomach.   Calcium Carbonate-Vitamin D (CALCIUM + D PO) Take 2 tablets by mouth daily.   fish oil-omega-3 fatty acids 1000 MG capsule Take 1 g by mouth daily.   Multiple Vitamin (MULTIVITAMIN WITH MINERALS) TABS Take 1 tablet by mouth daily.   No facility-administered encounter medications on file as of 09/01/2023.    Allergies (verified) Patient has no known allergies.    History: History reviewed. No pertinent past medical history. Past Surgical History:  Procedure Laterality Date   CHOLECYSTECTOMY     COLONOSCOPY  2010   10-11 yrs ago- normal per pt    Family History  Problem Relation Age of Onset   COPD Mother    Heart disease Father 59   Cancer Father 62       acute leukemia   Colon polyps Neg Hx    Colon cancer Neg Hx    Esophageal cancer Neg Hx    Rectal cancer Neg Hx    Stomach cancer Neg Hx    Social History   Socioeconomic History   Marital status: Widowed    Spouse name: Not on file   Number of children: Not on file   Years of education: Not on file   Highest education level: Not on file  Occupational History   Not on file  Tobacco Use   Smoking status: Never   Smokeless tobacco: Never  Vaping Use   Vaping status: Never Used  Substance and Sexual Activity   Alcohol use: Yes    Alcohol/week: 1.0 standard drink of alcohol    Types: 1 Glasses of wine per week    Comment: occasionally   Drug use: No   Sexual activity: Not on file  Other Topics Concern   Not on file  Social History Narrative   Not on file   Social Drivers of Health   Financial Resource Strain: Low Risk  (  09/01/2023)   Overall Financial Resource Strain (CARDIA)    Difficulty of Paying Living Expenses: Not hard at all  Food Insecurity: No Food Insecurity (09/01/2023)   Hunger Vital Sign    Worried About Running Out of Food in the Last Year: Never true    Ran Out of Food in the Last Year: Never true  Transportation Needs: No Transportation Needs (09/01/2023)   PRAPARE - Administrator, Civil Service (Medical): No    Lack of Transportation (Non-Medical): No  Physical Activity: Sufficiently Active (09/01/2023)   Exercise Vital Sign    Days of Exercise per Week: 6 days    Minutes of Exercise per Session: 60 min  Stress: No Stress Concern Present (09/01/2023)   [employer] of Occupational Health - Occupational Stress Questionnaire     Feeling of Stress : Not at all  Social Connections: Moderately Integrated (09/01/2023)   Social Connection and Isolation Panel [NHANES]    Frequency of Communication with Friends and Family: More than three times a week    Frequency of Social Gatherings with Friends and Family: More than three times a week    Attends Religious Services: More than 4 times per year    Active Member of [employer] or Organizations: Yes    Attends Banker Meetings: More than 4 times per year    Marital Status: Widowed    Tobacco Counseling Counseling given: Not Answered   Clinical Intake:  Pre-visit preparation completed: Yes  Pain : No/denies pain     BMI - recorded: 20.41 Nutritional Status: BMI of 19-24  Normal Nutritional Risks: None Diabetes: No  How often do you need to have someone help you when you read instructions, pamphlets, or other written materials from your doctor or pharmacy?: 1 - Never  Interpreter Needed?: No  Information entered by :: Theresa Mulligan LPN   Activities of Daily Living    09/01/2023    1:10 PM  In your present state of health, do you have any difficulty performing the following activities:  Hearing? 1  Comment Wears Hearing Aids  Vision? 0  Difficulty concentrating or making decisions? 0  Walking or climbing stairs? 0  Dressing or bathing? 0  Doing errands, shopping? 0  Preparing Food and eating ? N  Using the Toilet? N  In the past six months, have you accidently leaked urine? N  Do you have problems with loss of bowel control? N  Managing your Medications? N  Managing your Finances? N  Housekeeping or managing your Housekeeping? N    Patient Care Team: Kristian Covey, MD as PCP - General  Indicate any recent Medical Services you may have received from other than [facility] providers in the past year (date may be approximate).     Assessment:   This is a routine wellness examination for Jaime Briggs.  Hearing/Vision screen Hearing Screening -  Comments:: Wears Hearing Aids   Goals Addressed               This Visit's Progress     Increase physical activity (pt-stated)        Stay Active.       Depression Screen    09/01/2023    1:09 PM 08/19/2022    3:24 PM 08/19/2022    3:23 PM 05/30/2022   10:21 AM 04/23/2019    8:06 AM  PHQ 2/9 Scores  PHQ - 2 Score 0 0 0 0 0  PHQ- 9 Score  0    Fall Risk    09/01/2023    1:10 PM 08/19/2022    3:25 PM 05/30/2022   10:21 AM  Fall Risk   Falls in the past year? 0 0 0  Number falls in past yr: 0 0 0  Injury with Fall? 0 0 0  Risk for fall due to : No Fall Risks No Fall Risks No Fall Risks  Follow up Falls prevention discussed Falls prevention discussed Falls evaluation completed    MEDICARE RISK AT HOME: Medicare Risk at Home Any stairs in or around the home?: Yes If so, are there any without handrails?: No Home free of loose throw rugs in walkways, pet beds, electrical cords, etc?: Yes Adequate lighting in your home to reduce risk of falls?: Yes Life alert?: No Use of a cane, walker or w/c?: No Grab bars in the bathroom?: No Shower chair or bench in shower?: Yes Elevated toilet seat or a handicapped toilet?: No  TIMED UP AND GO:  Was the test performed?  No    Cognitive Function:        09/01/2023    1:11 PM 08/19/2022    3:26 PM  6CIT Screen  What Year? 0 points 0 points  What month? 0 points 0 points  What time? 0 points 0 points  Count back from 20 0 points 0 points  Months in reverse 0 points 0 points  Repeat phrase 0 points 0 points  Total Score 0 points 0 points    Immunizations Immunization History  Administered Date(s) Administered   Fluad Quad(high Dose 65+) 05/12/2021, 05/30/2022   Fluad Trivalent(High Dose 65+) 06/07/2023   Influenza Split 06/01/2011, 06/08/2012   Influenza,inj,Quad PF,6+ Mos 06/18/2013, 05/19/2014, 04/23/2019   Influenza-Unspecified 06/19/2015   PNEUMOCOCCAL CONJUGATE-20 05/12/2021   Td 08/09/2007   Tdap  04/23/2019   Zoster, Live 05/19/2014    TDAP status: Up to date  Flu Vaccine status: Up to date  Pneumococcal vaccine status: Up to date  Covid-19 vaccine status: Declined, Education has been provided regarding the importance of this vaccine but patient still declined. Advised may receive this vaccine at local pharmacy or Health Dept.or vaccine clinic. Aware to provide a copy of the vaccination record if obtained from local pharmacy or Health Dept. Verbalized acceptance and understanding.  Qualifies for Shingles Vaccine? Yes   Zostavax completed No   Shingrix Completed?: No.    Education has been provided regarding the importance of this vaccine. Patient has been advised to call insurance company to determine out of pocket expense if they have not yet received this vaccine. Advised may also receive vaccine at local pharmacy or Health Dept. Verbalized acceptance and understanding.  Screening Tests Health Maintenance  Topic Date Due   Zoster Vaccines- Shingrix (1 of 2) 04/24/2004   COVID-19 Vaccine (1 - 2024-25 season) Never done   MAMMOGRAM  08/12/2024   Medicare Annual Wellness (AWV)  08/31/2024   Colonoscopy  02/17/2025   DTaP/Tdap/Td (3 - Td or Tdap) 04/22/2029   Pneumonia Vaccine 4+ Years old  Completed   INFLUENZA VACCINE  Completed   DEXA SCAN  Completed   Hepatitis C Screening  Completed   HPV VACCINES  Aged Out    Health Maintenance  Health Maintenance Due  Topic Date Due   Zoster Vaccines- Shingrix (1 of 2) 04/24/2004   COVID-19 Vaccine (1 - 2024-25 season) Never done    Colorectal cancer screening: Type of screening: Colonoscopy. Completed 02/18/20. Repeat every 5 years  Mammogram status: Completed 08/12/22. Repeat every year  Bone Density status: Completed 12/09/22. Results reflect: Bone density results: OSTEOPENIA. Repeat every   years.     Additional Screening:  Hepatitis C Screening: does qualify; Completed 04/23/19  Vision Screening: Recommended annual  ophthalmology exams for early detection of glaucoma and other disorders of the eye. Is the patient up to date with their annual eye exam?  Yes  Who is the provider or what is the name of the office in which the patient attends annual eye exams? Dr Neale Burly If pt is not established with a provider, would they like to be referred to a provider to establish care? No .   Dental Screening: Recommended annual dental exams for proper oral hygiene    Community Resource Referral / Chronic Care Management:  CRR required this visit?  No   CCM required this visit?  No     Plan:     I have personally reviewed and noted the following in the patient's chart:   Medical and social history Use of alcohol, tobacco or illicit drugs  Current medications and supplements including opioid prescriptions. Patient is not currently taking opioid prescriptions. Functional ability and status Nutritional status Physical activity Advanced directives List of other physicians Hospitalizations, surgeries, and ER visits in previous 12 months Vitals Screenings to include cognitive, depression, and falls Referrals and appointments  In addition, I have reviewed and discussed with patient certain preventive protocols, quality metrics, and best practice recommendations. A written personalized care plan for preventive services as well as general preventive health recommendations were provided to patient.     Tillie Rung, LPN   1/61/0960   After Visit Summary: (MyChart) Due to this being a telephonic visit, the after visit summary with patients personalized plan was offered to patient via MyChart   Nurse Notes: None

## 2023-09-01 NOTE — Patient Instructions (Addendum)
Ms. Hannibal , Thank you for taking time to come for your Medicare Wellness Visit. I appreciate your ongoing commitment to your health goals. Please review the following plan we discussed and let me know if I can assist you in the future.   Referrals/Orders/Follow-Ups/Clinician Recommendations:   This is a list of the screening recommended for you and due dates:  Health Maintenance  Topic Date Due   Zoster (Shingles) Vaccine (1 of 2) 04/24/2004   COVID-19 Vaccine (1 - 2024-25 season) Never done   Mammogram  08/12/2024   Medicare Annual Wellness Visit  08/31/2024   Colon Cancer Screening  02/17/2025   DTaP/Tdap/Td vaccine (3 - Td or Tdap) 04/22/2029   Pneumonia Vaccine  Completed   Flu Shot  Completed   DEXA scan (bone density measurement)  Completed   Hepatitis C Screening  Completed   HPV Vaccine  Aged Out    Advanced directives: (Declined) Advance directive discussed with you today. Even though you declined this today, please call our office should you change your mind, and we can give you the proper paperwork for you to fill out.  Next Medicare Annual Wellness Visit scheduled for next year: Yes

## 2023-10-06 ENCOUNTER — Telehealth: Payer: Self-pay

## 2023-10-06 NOTE — Telephone Encounter (Signed)
 Copied from CRM [id number]. Topic: Appointments - Appointment Scheduling >> Oct 06, 2023 10:57 AM Kathryne Eriksson wrote: Patient/patient representative is calling to schedule an appointment. Refer to attachments for appointment information. >> Oct 06, 2023 10:59 AM Kathryne Eriksson wrote: Patient states she's wanting to schedule to complete what's called a life line screening. Patient is requesting a call back at [phone], patient gives the consent to leave a detailed message if she doesn't answer.

## 2023-10-06 NOTE — Telephone Encounter (Signed)
 Left detailed message informing the patient to contact our office to schedule an appointment

## 2023-10-26 ENCOUNTER — Other Ambulatory Visit: Payer: Self-pay | Admitting: Family

## 2023-10-30 ENCOUNTER — Other Ambulatory Visit: Payer: Self-pay | Admitting: Family Medicine

## 2023-10-30 NOTE — Telephone Encounter (Signed)
 Copied from CRM [id number]. Topic: Clinical - Medication Refill >> Oct 30, 2023  3:29 PM Truddie Crumble wrote: Most Recent Primary Care Visit:  Provider: Tillie Rung  Department: [facility]  Visit Type: MEDICARE AWV, SEQUENTIAL  Date: 09/01/2023  Medication: alendronate (FOSAMAX) 70 MG tablet  Has the patient contacted their pharmacy? Yes (Agent: If no, request that the patient contact the pharmacy for the refill. If patient does not wish to contact the pharmacy document the reason why and proceed with request.) (Agent: If yes, when and what did the pharmacy advise?)  Is this the correct pharmacy for this prescription? Yes If no, delete pharmacy and type the correct one.  This is the patient's preferred pharmacy:  [id number] - [place], [place] - [address] [place] [address] Phone: [phone] Fax: [phone]   Has the prescription been filled recently? No  Is the patient out of the medication? Yes  Has the patient been seen for an appointment in the last year OR does the patient have an upcoming appointment? Yes  Can we respond through MyChart? Yes  Agent: Please be advised that Rx refills may take up to 3 business days. We ask that you follow-up with your pharmacy.

## 2023-10-31 ENCOUNTER — Telehealth: Payer: Self-pay

## 2023-10-31 NOTE — Telephone Encounter (Signed)
 Copied from CRM [id number]. Topic: Clinical - Prescription Issue >> Oct 31, 2023  2:19 PM Elizebeth Brooking wrote: Reason for CRM: Pharmacy stated that they were unable to refill prescription  alendronate (FOSAMAX) 70 MG tablet , would like for someone to give her a call explaining this

## 2023-11-01 MED ORDER — ALENDRONATE SODIUM 70 MG PO TABS: 70.0000 mg | ORAL_TABLET | ORAL | 4 refills | Status: DC

## 2023-11-01 NOTE — Telephone Encounter (Signed)
 Received prescription renewal request and medication has been refilled.

## 2024-01-25 ENCOUNTER — Other Ambulatory Visit: Payer: Self-pay | Admitting: Family Medicine

## 2024-02-08 DIAGNOSIS — S30861A Insect bite (nonvenomous) of abdominal wall, initial encounter: Secondary | ICD-10-CM | POA: Diagnosis not present

## 2024-03-21 ENCOUNTER — Other Ambulatory Visit (HOSPITAL_BASED_OUTPATIENT_CLINIC_OR_DEPARTMENT_OTHER): Payer: Self-pay | Admitting: Family Medicine

## 2024-03-21 DIAGNOSIS — Z1231 Encounter for screening mammogram for malignant neoplasm of breast: Secondary | ICD-10-CM

## 2024-03-26 ENCOUNTER — Inpatient Hospital Stay (HOSPITAL_BASED_OUTPATIENT_CLINIC_OR_DEPARTMENT_OTHER): Admission: RE | Admit: 2024-03-26 | Source: Ambulatory Visit | Admitting: Radiology

## 2024-03-29 ENCOUNTER — Encounter (HOSPITAL_BASED_OUTPATIENT_CLINIC_OR_DEPARTMENT_OTHER): Payer: Self-pay | Admitting: Radiology

## 2024-03-29 ENCOUNTER — Ambulatory Visit (HOSPITAL_BASED_OUTPATIENT_CLINIC_OR_DEPARTMENT_OTHER)
Admission: RE | Admit: 2024-03-29 | Discharge: 2024-03-29 | Disposition: A | Discharge status: Home / Self Care | Source: Ambulatory Visit | Attending: Family Medicine | Admitting: Family Medicine

## 2024-03-29 DIAGNOSIS — Z1231 Encounter for screening mammogram for malignant neoplasm of breast: Secondary | ICD-10-CM | POA: Diagnosis not present

## 2024-06-07 ENCOUNTER — Ambulatory Visit: Admitting: Family Medicine

## 2024-06-07 ENCOUNTER — Encounter: Payer: Self-pay | Admitting: Family Medicine

## 2024-06-07 ENCOUNTER — Ambulatory Visit: Payer: Self-pay | Admitting: Family Medicine

## 2024-06-07 VITALS — BP 120/80 | HR 67 | Temp 97.8°F | Ht 61.0 in | Wt 107.0 lb

## 2024-06-07 DIAGNOSIS — Z0000 Encounter for general adult medical examination without abnormal findings: Secondary | ICD-10-CM | POA: Diagnosis not present

## 2024-06-07 DIAGNOSIS — Z23 Encounter for immunization: Secondary | ICD-10-CM | POA: Diagnosis not present

## 2024-06-07 LAB — HEPATIC FUNCTION PANEL
ALT: 17 U/L (ref 0–35)
AST: 19 U/L (ref 0–37)
Albumin: 4.4 g/dL (ref 3.5–5.2)
Alkaline Phosphatase: 45 U/L (ref 39–117)
Bilirubin, Direct: 0.1 mg/dL (ref 0.0–0.3)
Total Bilirubin: 0.6 mg/dL (ref 0.2–1.2)
Total Protein: 7.3 g/dL (ref 6.0–8.3)

## 2024-06-07 LAB — CBC WITH DIFFERENTIAL/PLATELET
Basophils Absolute: 0 K/uL (ref 0.0–0.1)
Basophils Relative: 1.1 % (ref 0.0–3.0)
Eosinophils Absolute: 0.3 K/uL (ref 0.0–0.7)
Eosinophils Relative: 7.4 % — ABNORMAL HIGH (ref 0.0–5.0)
HCT: 40.9 % (ref 36.0–46.0)
Hemoglobin: 13.7 g/dL (ref 12.0–15.0)
Lymphocytes Relative: 30.3 % (ref 12.0–46.0)
Lymphs Abs: 1.1 K/uL (ref 0.7–4.0)
MCHC: 33.5 g/dL (ref 30.0–36.0)
MCV: 94.2 fl (ref 78.0–100.0)
Monocytes Absolute: 0.3 K/uL (ref 0.1–1.0)
Monocytes Relative: 9.5 % (ref 3.0–12.0)
Neutro Abs: 1.8 K/uL (ref 1.4–7.7)
Neutrophils Relative %: 51.7 % (ref 43.0–77.0)
Platelets: 200 K/uL (ref 150.0–400.0)
RBC: 4.35 Mil/uL (ref 3.87–5.11)
RDW: 13.1 % (ref 11.5–15.5)
WBC: 3.5 K/uL — ABNORMAL LOW (ref 4.0–10.5)

## 2024-06-07 LAB — LIPID PANEL
Cholesterol: 174 mg/dL (ref 0–200)
HDL: 79.1 mg/dL (ref >39.00)
LDL Cholesterol: 86 mg/dL (ref 0–99)
NonHDL: 95.1
Total CHOL/HDL Ratio: 2
Triglycerides: 47 mg/dL (ref 0.0–149.0)
VLDL: 9.4 mg/dL (ref 0.0–40.0)

## 2024-06-07 LAB — BASIC METABOLIC PANEL WITH GFR
BUN: 14 mg/dL (ref 6–23)
CO2: 29 meq/L (ref 19–32)
Calcium: 8.9 mg/dL (ref 8.4–10.5)
Chloride: 105 meq/L (ref 96–112)
Creatinine, Ser: 0.66 mg/dL (ref 0.40–1.20)
GFR: 89.06 mL/min (ref >60.00)
Glucose, Bld: 79 mg/dL (ref 70–99)
Potassium: 4.1 meq/L (ref 3.5–5.1)
Sodium: 140 meq/L (ref 135–145)

## 2024-06-07 NOTE — Patient Instructions (Addendum)
 Consider Shingrix vaccine- and check on insurance coverage.    Repeat DEXA scan around May 2026.

## 2024-06-07 NOTE — Progress Notes (Signed)
 Established Patient Office Visit  Subjective   Patient ID: Jaime Briggs, female    DOB: [birth year]  Age: 68 y.o. MRN: [id number]  Chief Complaint  Patient presents with   Annual Exam    HPI   Jaime Briggs is seen for physical exam.  She is very health-conscious and exercises regularly with combination of walking and strength training.  She does have history of osteopenia and just recently last year went back on Fosamax .  This may will be 2 years since her last DEXA.  She has history of stroke in several members including mother, cousin, uncle, grandmother.  Her mom had hemorrhagic stroke.  Other than her osteopenia generally very healthy.  Health maintenance reviewed:  Health Maintenance  Topic Date Due   Zoster Vaccines- Shingrix (1 of 2) 04/24/2004   COVID-19 Vaccine (1 - 2025-26 season) Never done   Medicare Annual Wellness (AWV)  08/31/2024   Colonoscopy  02/17/2025   Mammogram  03/29/2026   DTaP/Tdap/Td (3 - Td or Tdap) 04/22/2029   Pneumococcal Vaccine: 50+ Years  Completed   Influenza Vaccine  Completed   DEXA SCAN  Completed   Hepatitis C Screening  Completed   Meningococcal B Vaccine  Aged Out   Family history-father had coronary disease in his early 16s and died of some type of acute leukemia in his 82s.  Mom died age 40.  She had reported COPD but died of hemorrhagic CVA.  She has a sister who is alive and well   Social history-she is retired.  She is widowed.  Non-smoker.  No alcohol.  Continues to help care for her son who has some physical and mental disabilities related to prior MVA.  History reviewed. No pertinent past medical history. Past Surgical History:  Procedure Laterality Date   CHOLECYSTECTOMY     COLONOSCOPY  2010   10-11 yrs ago- normal per pt     reports that she has never smoked. She has never used smokeless tobacco. She reports current alcohol use of about 1.0 standard drink of alcohol per week. She reports that she does not use drugs. family  history includes COPD in her mother; Cancer (age of onset: 57) in her father; Heart disease (age of onset: 38) in her father. No Known Allergies  Review of Systems  Constitutional:  Negative for chills, fever, malaise/fatigue and weight loss.  HENT:  Negative for hearing loss.   Eyes:  Negative for blurred vision and double vision.  Respiratory:  Negative for cough and shortness of breath.   Cardiovascular:  Negative for chest pain, palpitations and leg swelling.  Gastrointestinal:  Negative for abdominal pain, blood in stool, constipation and diarrhea.  Genitourinary:  Negative for dysuria.  Skin:  Negative for rash.  Neurological:  Negative for dizziness, speech change, seizures, loss of consciousness and headaches.  Psychiatric/Behavioral:  Negative for depression.       Objective:     BP 120/80   Pulse 67   Temp 97.8 F (36.6 C) (Oral)   Ht 5' 1 (1.549 m)   Wt 107 lb (48.5 kg)   SpO2 95%   BMI 20.22 kg/m  BP Readings from Last 3 Encounters:  06/07/24 120/80  06/07/23 130/70  12/19/22 126/64   Wt Readings from Last 3 Encounters:  06/07/24 107 lb (48.5 kg)  09/01/23 108 lb (49 kg)  06/07/23 108 lb 6.4 oz (49.2 kg)      Physical Exam Vitals reviewed.  Constitutional:      General:  She is not in acute distress.    Appearance: She is well-developed. She is not ill-appearing.  HENT:     Head: Normocephalic and atraumatic.     Right Ear: Tympanic membrane normal.     Left Ear: Tympanic membrane normal.  Eyes:     Pupils: Pupils are equal, round, and reactive to light.  Neck:     Thyroid : No thyromegaly.  Cardiovascular:     Rate and Rhythm: Normal rate and regular rhythm.     Heart sounds: Normal heart sounds. No murmur heard. Pulmonary:     Effort: No respiratory distress.     Breath sounds: Normal breath sounds. No wheezing or rales.  Abdominal:     General: Bowel sounds are normal. There is no distension.     Palpations: Abdomen is soft. There is no  mass.     Tenderness: There is no abdominal tenderness. There is no guarding or rebound.  Musculoskeletal:        General: Normal range of motion.     Cervical back: Normal range of motion and neck supple.     Right lower leg: No edema.     Left lower leg: No edema.  Lymphadenopathy:     Cervical: No cervical adenopathy.  Skin:    Findings: No rash.  Neurological:     Mental Status: She is alert and oriented to person, place, and time.     Cranial Nerves: No cranial nerve deficit.  Psychiatric:        Behavior: Behavior normal.        Thought Content: Thought content normal.        Judgment: Judgment normal.      No results found for any visits on 06/07/24.    The 10-year ASCVD risk score (Arnett DK, et al., 2019) is: 7.8%    Assessment & Plan:   Problem List Items Addressed This Visit   None Visit Diagnoses       Need for influenza vaccination    -  Primary   Relevant Orders   Flu vaccine HIGH DOSE PF(Fluzone Trivalent) (Completed)     Physical exam       Relevant Orders   Basic metabolic panel with GFR   Lipid panel   CBC with Differential/Platelet   Hepatic function panel     Healthy 68 year old female.  She is very health-conscious and exercises regularly.  She does have osteopenia with prior T-score -2.1.  We discussed the following health maintenance items  -Continue annual flu vaccine-given today - We did discuss consideration for Shingrix and RSV by age 80 - Consider repeat DEXA by May of this next year. - Colonoscopy due 7/26 - Obtain screening labs as above - Continue regular weightbearing exercise along with daily calcium and vitamin D  No follow-ups on file.    Wolm Scarlet, MD

## 2024-07-13 ENCOUNTER — Other Ambulatory Visit: Payer: Self-pay | Admitting: Family Medicine
